# Patient Record
Sex: Male | Born: 1965 | Race: White | Hispanic: No | Marital: Married | Smoking: Never smoker
Health system: Southern US, Community
[De-identification: ages and names within clinical notes are randomized; demographics above are authoritative.]

## PROBLEM LIST (undated history)

## (undated) DIAGNOSIS — I4510 Unspecified right bundle-branch block: Secondary | ICD-10-CM

## (undated) DIAGNOSIS — G4733 Obstructive sleep apnea (adult) (pediatric): Secondary | ICD-10-CM

## (undated) DIAGNOSIS — J189 Pneumonia, unspecified organism: Secondary | ICD-10-CM

## (undated) DIAGNOSIS — E785 Hyperlipidemia, unspecified: Secondary | ICD-10-CM

## (undated) DIAGNOSIS — R7303 Prediabetes: Secondary | ICD-10-CM

## (undated) DIAGNOSIS — I10 Essential (primary) hypertension: Secondary | ICD-10-CM

## (undated) DIAGNOSIS — T7840XA Allergy, unspecified, initial encounter: Secondary | ICD-10-CM

## (undated) HISTORY — DX: Essential (primary) hypertension: I10

## (undated) HISTORY — DX: Unspecified right bundle-branch block: I45.10

## (undated) HISTORY — DX: Hyperlipidemia, unspecified: E78.5

## (undated) HISTORY — PX: HAND SURGERY: SHX662

## (undated) HISTORY — DX: Allergy, unspecified, initial encounter: T78.40XA

## (undated) HISTORY — DX: Obstructive sleep apnea (adult) (pediatric): G47.33

## (undated) HISTORY — PX: HERNIA REPAIR: SHX51

## (undated) HISTORY — PX: TONSILLECTOMY: SHX5217

## (undated) HISTORY — PX: INGUINAL HERNIA REPAIR: SHX194

## (undated) HISTORY — DX: Prediabetes: R73.03

---

## 2003-05-01 ENCOUNTER — Emergency Department (HOSPITAL_COMMUNITY): Admission: EM | Admit: 2003-05-01 | Discharge: 2003-05-02 | Payer: Self-pay | Admitting: Emergency Medicine

## 2003-05-02 ENCOUNTER — Emergency Department (HOSPITAL_COMMUNITY): Admission: EM | Admit: 2003-05-02 | Discharge: 2003-05-02 | Payer: Self-pay | Admitting: Emergency Medicine

## 2005-09-16 ENCOUNTER — Inpatient Hospital Stay (HOSPITAL_COMMUNITY): Admission: EM | Admit: 2005-09-16 | Discharge: 2005-09-17 | Payer: Self-pay | Admitting: Emergency Medicine

## 2007-03-31 ENCOUNTER — Ambulatory Visit: Payer: Self-pay | Admitting: Pulmonary Disease

## 2007-03-31 DIAGNOSIS — I10 Essential (primary) hypertension: Secondary | ICD-10-CM | POA: Insufficient documentation

## 2007-03-31 DIAGNOSIS — E785 Hyperlipidemia, unspecified: Secondary | ICD-10-CM | POA: Insufficient documentation

## 2007-04-02 ENCOUNTER — Ambulatory Visit (HOSPITAL_BASED_OUTPATIENT_CLINIC_OR_DEPARTMENT_OTHER): Admission: RE | Admit: 2007-04-02 | Discharge: 2007-04-02 | Payer: Self-pay | Admitting: Pulmonary Disease

## 2007-04-02 ENCOUNTER — Ambulatory Visit: Payer: Self-pay | Admitting: Pulmonary Disease

## 2007-04-15 ENCOUNTER — Telehealth (INDEPENDENT_AMBULATORY_CARE_PROVIDER_SITE_OTHER): Payer: Self-pay | Admitting: *Deleted

## 2008-04-30 ENCOUNTER — Encounter: Payer: Self-pay | Admitting: Pulmonary Disease

## 2008-05-04 ENCOUNTER — Ambulatory Visit: Payer: Self-pay | Admitting: Pulmonary Disease

## 2008-05-04 DIAGNOSIS — G4733 Obstructive sleep apnea (adult) (pediatric): Secondary | ICD-10-CM | POA: Insufficient documentation

## 2008-05-06 ENCOUNTER — Telehealth (INDEPENDENT_AMBULATORY_CARE_PROVIDER_SITE_OTHER): Payer: Self-pay | Admitting: *Deleted

## 2008-08-05 ENCOUNTER — Encounter: Admission: RE | Admit: 2008-08-05 | Discharge: 2008-08-05 | Payer: Self-pay | Admitting: Family Medicine

## 2009-09-28 ENCOUNTER — Encounter: Admission: RE | Admit: 2009-09-28 | Discharge: 2009-09-28 | Payer: Self-pay | Admitting: Neurosurgery

## 2009-10-09 ENCOUNTER — Encounter: Admission: RE | Admit: 2009-10-09 | Discharge: 2009-10-09 | Payer: Self-pay | Admitting: Neurosurgery

## 2009-11-03 ENCOUNTER — Encounter: Admission: RE | Admit: 2009-11-03 | Discharge: 2009-11-03 | Payer: Self-pay | Admitting: Neurosurgery

## 2010-05-02 ENCOUNTER — Encounter: Payer: Self-pay | Admitting: Internal Medicine

## 2010-05-04 ENCOUNTER — Ambulatory Visit: Payer: Self-pay | Admitting: Pulmonary Disease

## 2010-05-22 NOTE — Procedures (Signed)
NAME:  [name], CREDIT NO.:  [id number]   MEDICAL RECORD NO.:  [id number]          PATIENT TYPE:  OUT   LOCATION:  SLEEP CENTER                 FACILITY:  [facility]   PHYSICIAN:  Coralyn Helling, MD        DATE OF BIRTH:  01-11-1965   DATE OF STUDY:  04/02/2007                            NOCTURNAL POLYSOMNOGRAM   REFERRING PHYSICIAN:  Coralyn Helling, MD   INDICATION:  This is an individual who has witnessed apnea, snoring, and  hypertension.  He is referred to the sleep lab for evaluation of  hypersomnia with obstructive sleep apnea.   Height is 5 feet 10 inches tall.  Weight is 245 pounds.  BMI is 35.  Neck size is 17.3.   MEDICATIONS:  1. Azor.  2. Crestor.   EPWORTH SLEEPINESS SCORE:  20   SLEEP ARCHITECTURE:  The patient followed a split night protocol.   During the diagnostic portion of the test, the total sleep period was  268 minutes.  Total sleep time was 231 minutes.  Sleep efficiency was  85%.  Sleep latency is 3 minutes which is significantly reduced.  REM  latency is 160 minutes.  The patient spent a total of 18% of total sleep  time in stage 1 sleep, 64% of total sleep time in stage 2 sleep, 0% of  total sleep time in stage 3 sleep, and 17% of the time in REM sleep.  The patient slept predominantly in the nonsupine position.   During the therapeutic portion of the test, sleep period time was 168  minutes, total sleep time 161 minutes.  Sleep efficiency was 88%.  Sleep  latency was 14 minutes.  REM latency was 66 minutes.  The patient spent  a total of 8% of total sleep time in stage 1 sleep, 69% of sleep time in  stage 2 sleep, 0% of sleep time stage 3 sleep, and 23% of sleep time in  REM sleep.  The patient slept predominately in the nonsupine position.   RESPIRATORY DATA:  During the diagnostic portion of the test, the  patient was found to have a apnea-hypopnea index of 52.  There were 3  central apneic events.  The remainder of events were  obstructive in  nature.  The nonsupine apnea-hypopnea index was 24.  The supine apnea-  hypopnea index was 100.  The REM apnea-hypopnea index was 23.  The non-  REM apnea-hypopnea index was 58.  Loud snoring was noted by the  technician.   During the therapeutic portion of the test, the patient was titrated  from a CPAP pressure setting of 5 to 11 cm of water.  At a CPAP pressure  setting of 11 cm of water, the apnea-hypopnea index reduced to 3.  At  this pressure setting, the patient was observed in REM sleep but not  supine sleep, and snoring was eliminated.   OXYGEN DATA:  The baseline oxygenation was 96%.  The oxygen saturation  nadir was 75%.  During the therapeutic portion of the test, at a CPAP  pressure setting of 11 cm of water, the oxygen saturation nadir was 92%.  CARDIAC DATA:  The average heart rate was 74 and the rhythm strip showed  normal sinus rhythm.   MOVEMENT-PARASOMNIA:  The periodic leg movement index was 0.  The  patient had 1 restroom trip.   IMPRESSION:  This study shows evidence for severe obstructive sleep  apnea as demonstrated by an apnea-hypopnea index of 52 with an oxygen  saturation nadir of 75%.  He did have a significant positional, as well  as REM effect to his sleep apnea.   During the therapeutic portion of the test, he was titrated to a CPAP  pressure setting of 11 cm of water.  At this pressure setting, his apnea-  hypopnea index was reduced to 3.  He was observed in REM sleep but not  supine sleep.   In addition to diet, exercise, and weight reduction, the patient would  likely benefit from the initiation of CPAP therapy.      Coralyn Helling, MD  Diplomat, American Board of Sleep Medicine  Electronically Signed     VS/MEDQ  D:  04/08/2007 12:02:27  T:  04/08/2007 12:53:31  Job:  [id number]

## 2010-05-25 NOTE — Discharge Summary (Signed)
[name], [name]                 ACCOUNT NO.:  [id number]   MEDICAL RECORD NO.:  [id number]          PATIENT TYPE:  INP   LOCATION:  6529                         FACILITY:  [facility]   PHYSICIAN:  Mohan N. Sharyn Lull, M.D. DATE OF BIRTH:  08-Oct-1965   DATE OF ADMISSION:  09/16/2005  DATE OF DISCHARGE:  09/17/2005                                 DISCHARGE SUMMARY   ADMITTING DIAGNOSES:  1. Chest pain, right bundle branch block, rule out pulmonary insufficiency      and portal hypertension,.  2. Morbid obesity.  3. Positive family history of coronary artery disease.   DISCHARGE DIAGNOSES:  1. Status post chest pain myocardial infarction, ruled out negative      Persantine Myoview.  2. Right bundle branch block.  3. Hypertension.  4. Elevated blood sugar rule out diabetes mellitus.  5. Hypercholesterolemia.  6. Morbid obesity.  7. Positive family history of coronary artery disease.   DISCHARGE HOME MEDICATIONS:  1. Toprol XL 25 mg 1 tablet daily.  2. Norvasc 5 mg 1 tablet daily.  3. Altace 5 mg 1 capsule daily.  4. Vytorin 10/20, 1 tablet daily.  5. Enteric-coated aspirin 81 mg 1 tablet daily.  6. Nitrostat 0.4 mg sublingual, use as directed.   DIET:  Low salt, low cholesterol.  Patient has been advised to avoid sweets  and starches.  Patient has been advised to reduce rate of fasting blood  sugar and hemoglobin A1c in two weeks.   CONDITION ON DISCHARGE:  Stable.  Follow up with me in 1 to 2 weeks.   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  Went home and then  went to Urgent Care.  Had EKG done  which showed normal sinus rhythm with  right bundle branch block and was referred to the ER for further evaluation.  Patient denies such episodes of tightness in the past but complains of  occasional shortness of breath, denies any palpitations, lightheadedness or  syncope.  Denies any neck trauma.  Denies PND, orthopnea, leg swelling.  Denies cough, fever, chills.   PAST MEDICAL HISTORY:  As above.   PAST SURGICAL HISTORY:  He had left hand surgery in the past, right index  finger surgery in the past, right inguinal hernia surgery as a child.   SOCIAL HISTORY:  He is married, has two children.  No history of smoking,  alcohol abuse or drug abuse.  He works for [employer] ([employer]).   FAMILY HISTORY:  Father is alive.  He had MI x2 in his 17s.  Mother is alive  in good health and brother in good health.   ALLERGIES:  NO KNOWN DRUG ALLERGIES.   HOME MEDICATIONS:  Hydrochlorothiazide 25 mg one tablet daily.  PHYSICAL EXAMINATION:  GENERAL:  He was alert. He was oriented x3, in no  acute distress.  VITAL SIGNS:  Blood pressure was 181/110.  Pulse was 76.  HEENT:  Conjunctivae pink.  NECK:  Supple, no JVD or lymphadenopathy.  LUNGS:  Clear to auscultation without rhonchi or rales.  CARDIOVASCULAR:  S1, S2, normal.  There was a mild systolic murmur and S4  gallop.  ABDOMEN:  Bowel sounds are present, nontender.  EXTREMITIES:  There is no clubbing, cyanosis or edema.   LABORATORY:  There is two sets of point of care.  CPK-MB was 2.3 and 5.0,  which were in normal range.  Troponin-I was less than 0.05.  Cholesterol was  221, LDL was 115, HDL was 39, triglycerides were 333.  Two sets of cardiac  enzymes:  CK of 180, MB 3.9, second set CK 140, MB 3.4.  Troponin-I was 0.02  and 0.02.  His sodium was 135, potassium 3.8, chloride 105, bicarb 25.  Glucose was 86. Repeat fasting blood sugar today was 111.  BUN was 15,  creatinine was 0.1.  His liver enzymes were normal.  His hemoglobin was   15.8, hematocrit 45.2, white count 8.2.   BRIEF HOSPITAL COURSE:  Patient was admitted to telemetry unit and monitored  by serial enzymes and EKG.  Patient did not have any exertional chest pain  during the hospital stay.  Patient subsequently underwent Persantine Myoview  which showed no evidence of __________ ischemia with EF of 56%.  Patient  will be discharged home on above medications and will be followed up in my  office in 2 weeks.  Patient has been advised regarding Nitrostat  modification to which he agrees.  Patient will be followed up in my office  in two weeks.           ______________________________  [name], M.D.     MNH/MEDQ  D:  09/17/2005  T:  09/18/2005  Job:  [id number]

## 2010-12-12 ENCOUNTER — Ambulatory Visit (INDEPENDENT_AMBULATORY_CARE_PROVIDER_SITE_OTHER): Payer: Managed Care, Other (non HMO)

## 2010-12-12 DIAGNOSIS — S301XXA Contusion of abdominal wall, initial encounter: Secondary | ICD-10-CM

## 2011-07-01 ENCOUNTER — Ambulatory Visit: Payer: Managed Care, Other (non HMO)

## 2011-07-01 ENCOUNTER — Ambulatory Visit (INDEPENDENT_AMBULATORY_CARE_PROVIDER_SITE_OTHER): Payer: Managed Care, Other (non HMO) | Admitting: Family Medicine

## 2011-07-01 VITALS — BP 149/84 | HR 83 | Temp 98.7°F | Resp 16 | Ht 71.0 in | Wt 241.0 lb

## 2011-07-01 DIAGNOSIS — M79673 Pain in unspecified foot: Secondary | ICD-10-CM

## 2011-07-01 DIAGNOSIS — S99929A Unspecified injury of unspecified foot, initial encounter: Secondary | ICD-10-CM

## 2011-07-01 DIAGNOSIS — S92909A Unspecified fracture of unspecified foot, initial encounter for closed fracture: Secondary | ICD-10-CM

## 2011-07-01 DIAGNOSIS — M79609 Pain in unspecified limb: Secondary | ICD-10-CM

## 2011-07-01 MED ORDER — HYDROCODONE-ACETAMINOPHEN 5-500 MG PO TABS: 1.0000 | ORAL_TABLET | Freq: Three times a day (TID) | ORAL | Status: AC | PRN

## 2011-07-01 NOTE — Progress Notes (Signed)
Patient Name: [name] Date of Birth: 1965/01/28 Medical Record Number: [id number] Gender: male Date of Encounter: 07/01/2011  History of Present Illness:  [name] is a 46 y.o. very pleasant male patient who presents with the following:  Yesterday while walking dog he twisted his right foot and heard a snap.  He could barely walk on it- not any better today.  It is also swollen.  No previous problems with this foot.    Patient Active Problem List  Diagnosis  . HYPERLIPIDEMIA  . OBSTRUCTIVE SLEEP APNEA  . HYPERTENSION   Past Medical History  Diagnosis Date  . Hyperlipidemia   . Hypertension   . Angina pectoris   . OSA (obstructive sleep apnea)    Past Surgical History  Procedure Date  . Hand surgery     left  . Inguinal hernia repair     right  . Tonsillectomy    History  Substance Use Topics  . Smoking status: Never Smoker   . Smokeless tobacco: Not on file  . Alcohol Use: Not on file   Family History  Problem Relation Age of Onset  . Heart disease Father   . Cancer Mother    Allergies not on file  Medication list has been reviewed and updated.  Prior to Admission medications   Medication Sig Start Date End Date Taking? Authorizing Provider  amLODipine-valsartan (EXFORGE) 10-320 MG per tablet Take 1 tablet by mouth daily.   Yes Historical Provider, MD  aspirin 81 MG tablet Take 81 mg by mouth daily.     Yes Historical Provider, MD  hydrochlorothiazide (MICROZIDE) 12.5 MG capsule Take 12.5 mg by mouth daily.   Yes Historical Provider, MD  rosuvastatin (CRESTOR) 10 MG tablet Take 10 mg by mouth daily.     Yes Historical Provider, MD  amLODipine-olmesartan (AZOR) 10-40 MG per tablet Take 1 tablet by mouth daily.      Historical Provider, MD  naproxen sodium (ALEVE) 220 MG tablet Take 220 mg by mouth at bedtime.      Historical Provider, MD  Omega-3 Fatty Acids (FISH OIL) 1200 MG CAPS Take 1 capsule by mouth daily.      Historical Provider, MD     Review of Systems:  As per HPI- otherwise negative.   Physical Examination: Filed Vitals:   07/01/11 0749  BP: 149/84  Pulse: 83  Temp: 98.7 F (37.1 C)  Resp: 16   Filed Vitals:   07/01/11 0749  Height: 5\' 11"  (1.803 m)  Weight: 241 lb (109.317 kg)   Body mass index is 33.61 kg/(m^2). Ideal Body Weight: Weight in (lb) to have BMI = 25: 178.9    GEN: WDWN, NAD, Non-toxic, Alert & Oriented x 3, obese HEENT: Atraumatic, Normocephalic.  Ears and Nose: No external deformity. EXTR: No clubbing/cyanosis/edema NEURO: Normal gait.  PSYCH: Normally interactive. Conversant. Not depressed or anxious appearing.  Calm demeanor.  Right foot:  Tender and swollen along the 5th MT.  Ankle negative.  Achilles intact.  Normal pulses and perfusion/ cap refill  [facility] reading (PRIMARY) by  Dr. Patsy Lager.  Right foot: 5th MT shaft fracture, non- displaced.   RIGHT FOOT COMPLETE - 3+ VIEW  Comparison: None.  Findings: There is a nondisplaced fracture involving the proximal shaft of the fifth metatarsal. Overlying soft tissue swelling. No additional evidence of acute fracture.  IMPRESSION: Nondisplaced fifth metatarsal shaft fracture.  Assessment and Plan: 1. Pain in foot  DG Foot Complete Right  2. Injury, foot  3. Foot fracture  HYDROcodone-acetaminophen (VICODIN) 5-500 MG per tablet   Right 5th MT fracture.  Placed in short Cam boot- he has crutches already.  Instructed to avoid WB by using crutches.  Vicodin PRN, ice and elevate.  Refer to ortho this week.     Gave note for his job- he works in [employer] and walks all day at his job. OOW until released by ortho.    Abbe Amsterdam, MD

## 2011-07-26 ENCOUNTER — Telehealth: Payer: Self-pay

## 2011-07-26 NOTE — Telephone Encounter (Signed)
Patient needs a note for his employer that states that he was ordered out of work effective 07/01/11 and not the day that he was seen at [facility]. Please contact patient on his cell at [phone].

## 2011-07-27 NOTE — Telephone Encounter (Signed)
Looks like this should be ok to do, correct?

## 2011-07-28 NOTE — Telephone Encounter (Signed)
Letter written and patient notified, he states [employer] may need MD to complete another form-- if so he will drop it off--otherwise this should be sufficient.

## 2011-07-28 NOTE — Telephone Encounter (Signed)
This is fine.  OOW starting 07/01/11

## 2013-03-06 ENCOUNTER — Ambulatory Visit (INDEPENDENT_AMBULATORY_CARE_PROVIDER_SITE_OTHER): Payer: Managed Care, Other (non HMO) | Admitting: Physician Assistant

## 2013-03-06 VITALS — BP 118/80 | HR 83 | Temp 99.3°F | Resp 18 | Wt 255.0 lb

## 2013-03-06 DIAGNOSIS — R059 Cough, unspecified: Secondary | ICD-10-CM

## 2013-03-06 DIAGNOSIS — R05 Cough: Secondary | ICD-10-CM

## 2013-03-06 DIAGNOSIS — J329 Chronic sinusitis, unspecified: Secondary | ICD-10-CM

## 2013-03-06 MED ORDER — HYDROCOD POLST-CHLORPHEN POLST 10-8 MG/5ML PO LQCR: 5.0000 mL | Freq: Two times a day (BID) | ORAL | Status: AC

## 2013-03-06 MED ORDER — DOXYCYCLINE HYCLATE 100 MG PO TABS: 100.0000 mg | ORAL_TABLET | Freq: Two times a day (BID) | ORAL | Status: DC

## 2013-03-06 MED ORDER — IPRATROPIUM BROMIDE 0.06 % NA SOLN: 2.0000 | Freq: Three times a day (TID) | NASAL | Status: DC

## 2013-03-06 NOTE — Progress Notes (Signed)
   Subjective:    Patient ID: [name], male    DOB: 1965/08/05, 48 y.o.   MRN: [id number]  HPI Pt presents to clinic with about 10 day h/o sinus pressure and congestion with green rhinorrhea and then about 4 days ago started with a cough and sputum production.  He has been using a lot of cold preps and they seem to not be helping. He is worried because the cough is starting to keep him up at night and he has not been able to use his CPAP because he cannot breath through his nose.  He is not SOB and not wheezing -  Most of his cough feels like it is coming from his throat but it is starting to burn deeper in his chest.  Th eleft side of his face hurts and the teeth on that side are uncomfortable as well as he is having some slight dizziness.  Pt has been using more wood to heat his house over the last several days.  OTC meds - cold preps Sick contacts - family Flu vaccine - no  Review of Systems  Constitutional: Positive for chills. Negative for fever.  HENT: Positive for congestion, postnasal drip (better now) and rhinorrhea (green with some blood).   Respiratory: Positive for cough (green).   Gastrointestinal: Negative for nausea, vomiting and diarrhea.  Musculoskeletal: Positive for myalgias.  Neurological: Positive for dizziness and headaches.       Objective:   Physical Exam  Vitals reviewed. Constitutional: He is oriented to person, place, and time. He appears well-developed and well-nourished.  HENT:  Head: Normocephalic and atraumatic.  Right Ear: Hearing, tympanic membrane, external ear and ear canal normal.  Left Ear: Hearing, tympanic membrane, external ear and ear canal normal.  Nose: Mucosal edema (red - dried blood in both nares) present. Right sinus exhibits no maxillary sinus tenderness and no frontal sinus tenderness. Left sinus exhibits no maxillary sinus tenderness and no frontal sinus tenderness.  Mouth/Throat: Uvula is midline, oropharynx is clear and moist and  mucous membranes are normal.  Eyes: Conjunctivae are normal.  Neck: Neck supple.  Cardiovascular: Normal rate, regular rhythm and normal heart sounds.   No murmur heard. Pulmonary/Chest: Effort normal and breath sounds normal. He has no wheezes.  Lymphadenopathy:    He has no cervical adenopathy.  Neurological: He is alert and oriented to person, place, and time.  Skin: Skin is warm and dry.  Psychiatric: He has a normal mood and affect. His behavior is normal. Judgment and thought content normal.       Assessment & Plan:  Sinus infection - Plan: doxycycline (VIBRA-TABS) 100 MG tablet, ipratropium (ATROVENT) 0.06 % nasal spray  Cough - Plan: chlorpheniramine-HYDROcodone (TUSSIONEX PENNKINETIC ER) 10-8 MG/5ML LQCR  Will treat for sinus infection due to the length of time and pain that he is having.  We will treat his cough and he will continue the Mucincex he has at home and stop the sudafed.  We will try Atrovent NS to see if we can decrease his nasal congestion so he can start using his CPAP again.  Benny LennertSarah Mikaeel Petrow PA-C 03/06/2013 11:03 AM

## 2018-04-15 ENCOUNTER — Telehealth: Payer: Self-pay | Admitting: Neurology

## 2018-04-15 NOTE — Telephone Encounter (Signed)
Due to current COVID 19 pandemic, our office is severely reducing in office visits for at least the next 2 weeks, in order to minimize the risk to our patients and healthcare providers. Our staff will contact you for next steps. Pt understands that although there may be some limitations with this type of visit, we will take all precautions to reduce any security or privacy concerns. Pt understands that this will be treated like an in office visit and we will file with pt's insurance, and there may be a patient responsible charge related to this service. Pt's email is [email or web address]. Pt understands that the [employer] webex software must be downloaded and operational on the device pt plans to use for the visit. Pt understands that the nurse will be calling to go over pt's chart.

## 2018-04-16 ENCOUNTER — Encounter: Payer: Self-pay | Admitting: Neurology

## 2018-04-16 NOTE — Addendum Note (Signed)
Addended by: Geronimo Running A on: 04/16/2018 09:04 AM   Modules accepted: Orders

## 2018-04-16 NOTE — Telephone Encounter (Signed)
I called pt. Pt's meds, allergies, and PMH were updated.  Pt had a sleep study in 2007-2008 and was started on cpap. He stopped using his cppa because it was difficult to tolerate and he had to sleep in a recliner due to neck problems. Pt's cpap is about 45-55 years old.  Pt was instructed on how to measure his neck size prior to his appt next week.  Pt's recent weight is 275 lb and he is 5' 10.5.  Epworth Sleepiness Scale 0= would never doze 1= slight chance of dozing 2= moderate chance of dozing 3= high chance of dozing  Sitting and reading: 0 Watching TV: 1 Sitting inactive in a public place (ex. Theater or meeting): 0 As a passenger in a car for an hour without a break: 0 Lying down to rest in the afternoon: 2 Sitting and talking to someone: 0 Sitting quietly after lunch (no alcohol): 0 In a car, while stopped in traffic: 0 Total: 3  FSS: 18

## 2018-04-16 NOTE — Telephone Encounter (Signed)
I called pt to update his chart and discuss his appt next week. No answer, left a message asking him to call me back.

## 2018-04-23 ENCOUNTER — Ambulatory Visit (INDEPENDENT_AMBULATORY_CARE_PROVIDER_SITE_OTHER): Payer: 59 | Admitting: Neurology

## 2018-04-23 ENCOUNTER — Encounter: Payer: Self-pay | Admitting: Neurology

## 2018-04-23 ENCOUNTER — Other Ambulatory Visit: Payer: Self-pay

## 2018-04-23 DIAGNOSIS — E669 Obesity, unspecified: Secondary | ICD-10-CM

## 2018-04-23 DIAGNOSIS — R51 Headache: Secondary | ICD-10-CM

## 2018-04-23 DIAGNOSIS — G4733 Obstructive sleep apnea (adult) (pediatric): Secondary | ICD-10-CM

## 2018-04-23 DIAGNOSIS — R519 Headache, unspecified: Secondary | ICD-10-CM

## 2018-04-23 NOTE — Progress Notes (Signed)
Huston Foley, MD, PhD [facility] [address], [address] [address] [place], [place] [address]   Virtual Visit via Video Note on 04/23/2018:  I connected with [name] on 04/23/18 at  4:00 PM EDT by a video enabled telemedicine application and verified that I am speaking with the correct person using two identifiers.   I discussed the limitations of evaluation and management by telemedicine and the availability of in person appointments. The patient expressed understanding and agreed to proceed.  History of Present Illness: [name] is a 53 year old right-handed gentleman with an underlying medical history of prediabetes, hypertension, hyperlipidemia, allergies, right bundle branch block, history of angina pectoris and obesity, with whom I am conducting a virtual, video based visit via Webex today, in lieu of a face-to-face visit, for evaluation of his sleep disorder, in particular, concern for underlying obstructive sleep apnea, for re-evaluation. The patient is unaccompanied today and joins via cell phone from home. He is referred by his cardiologist, Dr. Rinaldo Cloud, and I reviewed his note from 02/20/2018.  He reports snoring and a prior diagnosis of moderate obstructive sleep apnea.prior sleep study results are not available for my review today. He estimates that he had a sleep study over 10 years ago, perhaps even 15 years ago. He tried a fullface mask but could not tolerate it. He ended up using nasal pillows and uses CPAP for some years in the beginning but has not been on it for years. He had discomfort in his neck and because he could not sleep in his bed with the neck pain and ended up sleeping in the recliner he eventually gave up using his CPAP in a few years.   His Epworth sleepiness score is 3 out of 24, fatigue severity score is 18 out of 63. He would be willing to get retested for sleep apnea and consider CPAP therapy again. His blood pressure is now  under much better control, a few years ago he had significant difficulty with blood pressure control, he is compliant with his BP medications. His bedtime is around 8 and rise time around 2:30, he works Adult nurse for the hospital.  He is not aware of any family history of OSA. He had a tonsillectomy as a child. He denies night to night nocturia but does wake up with a headache occasionally, uses either ibuprofen or Excedrin Migraine occasionally. He drinks caffeine in the form of diet soda, 2 per day on average, drinks alcohol occasionally, and is a nonsmoker. He lives with his wife and son. He has an older daughter who is married. He denies restless leg symptoms. They have 2 dogs in the household, one of them sleeps in the bed periodically with them and one of them on the floor in the bedroom. He does not sleep with a TV on. His neck pain has been better, he gets neck injections under Dr. Ollen Bowl every few months. he has gained weight over time. He particularly struggled with weight gain after bilateral metatarsal fractures.  His Past Medical History Is Significant For: Past Medical History:  Diagnosis Date   Allergy    Angina pectoris    Hyperlipidemia    Hypertension    OSA (obstructive sleep apnea)    Prediabetes    RBBB (right bundle branch block)    His Past Surgical History Is Significant For:  His Family History Is Significant For: Family History  Problem Relation Age of Onset   Heart disease Father  Diabetes Father    Cancer Mother     His Social History Is Significant For: Social History   Socioeconomic History   Marital status: Married    Spouse name: Not on file   Number of children: Not on file   Years of education: Not on file   Highest education level: Not on file  Occupational History   Occupation: professional driver  Social Needs   Financial resource strain: Not on file   Food insecurity:    Worry: Not on file    Inability: Not on file    Transportation needs:    Medical: Not on file    Non-medical: Not on file  Tobacco Use   Smoking status: Never Smoker   Smokeless tobacco: Never Used  Substance and Sexual Activity   Alcohol use: Yes   Drug use: No   Sexual activity: Not on file  Lifestyle   Physical activity:    Days per week: Not on file    Minutes per session: Not on file   Stress: Not on file  Relationships   Social connections:    Talks on phone: Not on file    Gets together: Not on file    Attends religious service: Not on file    Active member of club or organization: Not on file    Attends meetings of clubs or organizations: Not on file    Relationship status: Not on file  Other Topics Concern   Not on file  Social History Narrative   Lives with wife   Has children          His Allergies Are:  Allergies  Allergen Reactions   Penicillins Rash  :   His Current Medications Are:  Outpatient Encounter Medications as of 04/23/2018  Medication Sig   amLODipine (NORVASC) 10 MG tablet Take 10 mg by mouth daily.   aspirin 81 MG tablet Take 81 mg by mouth daily.     hydrochlorothiazide (HYDRODIURIL) 25 MG tablet Take 25 mg by mouth daily.   losartan (COZAAR) 100 MG tablet Take 100 mg by mouth daily.   Omega-3 Fatty Acids (FISH OIL) 1200 MG CAPS Take 1 capsule by mouth daily.     rosuvastatin (CRESTOR) 10 MG tablet Take 10 mg by mouth daily.     No facility-administered encounter medications on file as of 04/23/2018.   :   Review of Systems:  Out of a complete 14 point review of systems, all are reviewed and negative with the exception of these symptoms as listed below:  Observations/Objective: His most recent vital signs for my review are from 02/20/2018: Blood pressure 127/83, pulse 72. Weight by self-report is 271 pounds. On examination, he is in no acute distress. He is pleasant and conversant. Face is symmetric, speech clear without dysarthria or hypophonia or voice tremor  noted. Facial animation is normal. He wears bifocal corrective eyeglasses. Airway examination reveals a smaller airway entry, Mallampati class II, longer appearing uvula, tonsils absent. Neck circumference by self-report is 18-1/2 inches. Moderate overbite. He is able to walk without problems, he stands for this visit. Upper body movements are unremarkable, upper extremity coordination grossly intact.  Assessment and Plan: [name] is a 53 year old right-handed gentleman with an underlying medical history of prediabetes, hypertension, hyperlipidemia, allergies, right bundle branch block, history of angina pectoris and obesity, with whom I am conducting a virtual, video based new patient visit via Webex in lieu of a face-to-face visit for re-evaluation of his prior  Dx of obstructive sleep apnea. The patient's medical history and physical exam (albeit limited with current video-based evaluation) are concerning for a diagnosis of obstructive sleep apnea. I discussed with the patient the diagnosis of OSA, its prognosis and treatment options. I explained in particular the risks and ramifications of untreated moderate to severe OSA, especially with respect to developing cardiovascular disease down the Road, including congestive heart failure, difficult to treat hypertension, cardiac arrhythmias, or stroke. Even type 2 diabetes has, in part, been linked to untreated OSA. Symptoms of untreated OSA may include daytime sleepiness, memory problems, mood irritability and mood disorder such as depression and anxiety, lack of energy, as well as recurrent headaches, especially morning headaches. We talked about the importance of weight control. We talked about the importance of maintaining good sleep hygiene. I recommended the following at this time: home sleep test.  I explained the sleep test procedure to the patient and also outlined possible treatment options of OSA, including the use of a custom-made dental  device (which would require a referral to a specialist dentist), upper airway surgical options, (such as UPPP, which would involve a referral to an ENT). I also explained the CPAP vs. AutoPAP treatment option to the patient, who indicated that he would be willing to try CPAP if the need arises. I answered all his questions today and the patient was in agreement. I plan to see the patient back after the sleep study is completed and encouraged him to call with any interim questions, concerns, problems or updates.   Huston FoleySaima Kala Ambriz, MD, PhD    Follow Up Instructions: 1. HST, sleep lab staff will reach out to patient to arrange for either sending the unit to home address or a "drive by pickup" and for tutorial, making sure patient is comfortable with the unit and usage, and return of equipment, if necessary.  2. Consider AutoPap therapy, if home sleep test positive for obstructive sleep apnea, patient agreeable. 3. We talked about alternative treatment options and current limitations, due to virus pandemic.  4. Follow-up after starting AutoPap therapy, follow-up to be scheduled according to set-up date, typically within 31 to 89 days post treatment start. 5. Pursue healthy lifestyle, good sleep hygiene, healthy weight. 6. Call for any interim questions or concerns.    I discussed the assessment and treatment plan with the patient. The patient was provided an opportunity to ask questions and all were answered. The patient agreed with the plan and demonstrated an understanding of the instructions.   The patient was advised to call back or seek an in-person evaluation if the symptoms worsen or if the condition fails to improve as anticipated.  I provided 30 minutes of non-face-to-face time during this encounter.   Huston FoleySaima Kimball Appleby, MD

## 2018-05-14 ENCOUNTER — Other Ambulatory Visit: Payer: Self-pay

## 2018-05-14 ENCOUNTER — Ambulatory Visit (INDEPENDENT_AMBULATORY_CARE_PROVIDER_SITE_OTHER): Payer: 59 | Admitting: Neurology

## 2018-05-14 DIAGNOSIS — G4733 Obstructive sleep apnea (adult) (pediatric): Secondary | ICD-10-CM

## 2018-05-14 DIAGNOSIS — R519 Headache, unspecified: Secondary | ICD-10-CM

## 2018-05-14 DIAGNOSIS — R51 Headache: Secondary | ICD-10-CM

## 2018-05-14 DIAGNOSIS — E669 Obesity, unspecified: Secondary | ICD-10-CM

## 2018-05-21 ENCOUNTER — Telehealth: Payer: Self-pay

## 2018-05-21 NOTE — Telephone Encounter (Signed)
-----   Message from Huston Foley, MD sent at 05/21/2018  7:41 AM EDT ----- Patient referred by Dr. Sharyn Lull, seen virtually by me on 04/23/18, HST on 05/14/18 for re-eval of OSA.    Please call Coel and let him know, that the recent home sleep test confirmed obstructive sleep apnea in the severe range. While I recommend treatment for this in the form CPAP, we are not yet bringing patients in for in-lab testing for CPAP titration studies, due to the virus pandemic; therefore, I suggest we start him on a trial of autoPAP at home, which means, that we don't have to bring him in for a sleep study with CPAP, but will let him start using an autoPAP machine at home, through a DME company (of patient's choice, or as per insurance requirement, as per in [employer], if there are such restrictions, depending on insurance carrier). The DME representative will educate the patient on how to use the machine, how to put the mask on, etc. I have placed an order in the chart. Please send referral, talk to patient, send report to referring MD. We will need a FU in sleep clinic for 10 weeks post-PAP set up, please arrange that with me or one of our NPs.  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 For concerns and questions about how to clean the PAP machine and the supplies and how frequently to change the hose, mask and filters, etc., patient can call the DME company, for more information, education and troubleshooting. Especially in the current situation, I recommend, patients be extra mindful about hand hygiene, handling the PAP equipment only with clean hands, wipe the mask daily, keep little one and four-legged companions (and any other pets for that matter)  away from the machine and mask at all times.     Thanks,   Huston Foley, MD, PhD [facility])

## 2018-05-21 NOTE — Telephone Encounter (Signed)
Pt returned my call. I advised pt that Dr. Frances Furbish reviewed their sleep study results and found that pt has severe osa. Dr. Frances Furbish recommends that pt start an auto pap at home. I reviewed PAP compliance expectations with the pt. Pt is agreeable to starting an auto-PAP. I advised pt that an order will be sent to a DME, [employer], and [employer] will call the pt within about one week after they file with the pt's insurance. [employer] will show the pt how to use the machine, fit for masks, and troubleshoot the auto-PAP if needed. A follow up appt was made for insurance purposes with Shanda Bumps, NP on 08/07/2018 at 8:00am. (Pt requests a Friday.). Pt verbalized understanding to arrive 15 minutes early and bring their auto-PAP. A letter with all of this information in it will be mailed to the pt as a reminder. I verified with the pt that the address we have on file is correct. Pt verbalized understanding of results. Pt had no questions at this time but was encouraged to call back if questions arise. I have sent the order to [employer] and have received confirmation that they have received the order.

## 2018-05-21 NOTE — Progress Notes (Signed)
Patient referred by Dr. Sharyn Lull, seen virtually by me on 04/23/18, HST on 05/14/18 for re-eval of OSA.    Please call Rourke and let him know, that the recent home sleep test confirmed obstructive sleep apnea in the severe range. While I recommend treatment for this in the form CPAP, we are not yet bringing patients in for in-lab testing for CPAP titration studies, due to the virus pandemic; therefore, I suggest we start him on a trial of autoPAP at home, which means, that we don't have to bring him in for a sleep study with CPAP, but will let him start using an autoPAP machine at home, through a DME company (of patient's choice, or as per insurance requirement, as per in [employer], if there are such restrictions, depending on insurance carrier). The DME representative will educate the patient on how to use the machine, how to put the mask on, etc. I have placed an order in the chart. Please send referral, talk to patient, send report to referring MD. We will need a FU in sleep clinic for 10 weeks post-PAP set up, please arrange that with me or one of our NPs.  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 For concerns and questions about how to clean the PAP machine and the supplies and how frequently to change the hose, mask and filters, etc., patient can call the DME company, for more information, education and troubleshooting. Especially in the current situation, I recommend, patients be extra mindful about hand hygiene, handling the PAP equipment only with clean hands, wipe the mask daily, keep little one and four-legged companions (and any other pets for that matter) away from the machine and mask at all times.    Thanks,   Huston Foley,  MD, PhD [facility]

## 2018-05-21 NOTE — Telephone Encounter (Signed)
I called pt to discuss his sleep study results. No answer, left a message asking him to call me back. 

## 2018-05-21 NOTE — Procedures (Signed)
Patient Information     First Name: [name] Last Name: [name] ID: [id number]  Birth Date: May 19, 1965 Age: [age] Gender: Male  Referring Provider: Rinaldo CloudHarwani, Mohan, MD BMI: 36.6 (70/255lbs)   Neck Circ.:  18 '' Epworth:  3/24 FSS: 18/63   Sleep Study Information     Study Date: May 14, 2018 S/H/A Version: 5.1.76.4 / 4.1.1528 / 8576   History: Mr. [name] is a 53 year old right-handed gentleman with an underlying medical history of prediabetes, hypertension, hyperlipidemia, allergies, right bundle branch block, history of angina pectoris and obesity, who was previously diagnosed with obstructive sleep apnea and placed on CPAP therapy. He no longer is on treatment and presents for re-evaluation.   Summary & Diagnosis:     OSA Recommendations:     This home sleep test demonstrates severe obstructive sleep apnea with a total AHI of 43.1/hour and O2 nadir of 71%. Treatment with positive airway pressure (PAP) - in the form of CPAP - is recommended. This will require, ideally, a full night CPAP titration study for proper treatment settings, O2 monitoring and mask fitting. Based on the severity of the sleep disordered breathing, an attended titration study is indicated. However, under the current circumstances (i.e. the COVID-19 pandemic), in order to ensure continuity of care and for the safety of the patient and healthcare professionals, he will be advised to proceed with an autoPAP titration/trial at home. A proper overnight, lab-attended PAP titration study with CPAP may be helpful or needed down the road to optimize treatment, when considered safe. Please note, that untreated obstructive sleep apnea may carry additional perioperative morbidity. Patients with significant obstructive sleep apnea should receive perioperative PAP therapy and the surgeons and particularly the anesthesiologist should be informed of the diagnosis and the severity of the sleep disordered breathing. Patient will be reminded  regarding compliance with the PAP machine and to be mindful of cleanliness with the equipment and timely with supply changes (i.e. changing filter, mask, hose, humidifier chamber on an ongoing basis, as recommended, and cleaning parts that touch the face and nose daily, etc). The patient should be cautioned not to drive, work at heights, or operate dangerous or heavy equipment when tired or sleepy. Review and reiteration of good sleep hygiene measures should be pursued with any patient. Other causes of the patient's symptoms, including circadian rhythm disturbances, an underlying mood disorder, medication effect and/or an underlying medical problem cannot be ruled out based on this test. Clinical correlation is recommended. The patient and his referring provider will be notified of the test results. The [facility], either for a face-to-face or virtual visit, whichever feasible and recommended at the time.  I certify that I have reviewed the raw data recording prior to the issuance of this report in accordance with the standards of the American Academy of Sleep Medicine (AASM).  Huston FoleySaima Rolin Schult, MD, PhD [facility] Diplomat, ABPN (Neurology and Sleep)       Sleep Summary  Oxygen Saturation Statistics   Start Study Time: End Study Time: Total Recording Time:  8:00:39 PM 1:22:22 AM       5 hrs, 21 min  Total Sleep Time % REM of Sleep Time:  4 hrs, 50 min 27.2    Mean: 90 Minimum: 71 Maximum: 100  Mean of Desaturations Nadirs (%):   86  Oxygen Desaturation %:   4-9 10-20 >20 Total  Events Number Total   125  46 72.3 26.6  2 1.2  173 100.0  Oxygen Saturation: <90 <=88 <85 <80 <70  Duration (minutes): Sleep % 78.3 26.9 50.0 20.6 17.2 7.1 4.4 1.5 0.0 0.0     Respiratory Indices      Total Events REM NREM All Night  pRDI:  208  pAHI:  207 ODI:  173  pAHIc:  17  % CSR: 0.0 59.9 59.9 49.1 8.4  37.2 36.9 31.2 1.7 43.4 43.1 36.1 3.5       Pulse Rate Statistics during Sleep (BPM)      Mean: 74 Minimum: 53 Maximum: 103    Indices are calculated using technically valid sleep time of  4 hrs, 47 min. pRDI/pAHI are calculated using oxi desaturations ? 3%   Body Position Statistics  Position Supine Prone Right Left Non-Supine  Sleep (min) 148.7 55.0 46.0 41.0 142.0  Sleep % 51.1 18.9 15.8 14.1 48.9  pRDI 45.6 45.1 13.1 67.1 41.0  pAHI 45.2 45.1 13.1 67.1 41.0  ODI 35.0 38.5 10.5 65.6 37.1     Snoring Statistics Snoring Level (dB) >40 >50 >60 >70 >80 >Threshold (45)  Sleep (min) 136.2 40.1 10.1 0.6 0.0 68.8  Sleep % 46.8 13.8 3.5 0.2 0.0 23.7    Mean: 44 dB Sleep Stages Chart                                                                  pAHI=43.1                                                                               Mild              Moderate                    Severe                                                    5              15                    30

## 2018-05-21 NOTE — Addendum Note (Signed)
Addended by: Huston Foley on: 05/21/2018 07:41 AM   Modules accepted: Orders

## 2018-08-07 ENCOUNTER — Ambulatory Visit: Payer: Self-pay | Admitting: Adult Health

## 2018-08-19 NOTE — Progress Notes (Addendum)
PATIENT: [name] DOB: 06-19-1965  REASON FOR VISIT: follow up HISTORY FROM: patient  Chief Complaint  Patient presents with   Follow-up    New auto pap user. Alone. Rm 5. No new concerns at this time.      HISTORY OF PRESENT ILLNESS: Today 08/20/18 [name] is a 53 y.o. male here today for follow up of OSA on CPAP.  [name] is doing very well on CPAP therapy.  He reports that he is sleeping better and feels more energized during the day.  Compliance report dated 07/20/2018 through 08/18/2018 reveals that he is using CPAP nightly for compliance 100%.  22 of the last 30 days he used CPAP greater than 4 hours for compliance of 73%.  Average usage was 4 hours and 50 minutes.  AHI was 1.5 on 7 to 15 cm of water and an EPR of 3.  There was no significant leak.  He returns today for evaluation  HISTORY: (copied from Dr Teofilo PodAthar's note on 04/23/2018)  Mr. Steven ShamJames Bowen is a 53 year old right-handed gentleman with an underlying medical history of prediabetes, hypertension, hyperlipidemia, allergies, right bundle branch block, history of angina pectoris and obesity, with whom I am conducting a virtual, video based visit via Webex today, in lieu of a face-to-face visit, for evaluation of his sleep disorder, in particular, concern for underlying obstructive sleep apnea, for re-evaluation. The patient is unaccompanied today and joins via cell phone from home. He is referred by his cardiologist, Dr. Rinaldo CloudMohan Harwani, and I reviewed his note from 02/20/2018.  He reports snoring and a prior diagnosis of moderate obstructive sleep apnea.prior sleep study results are not available for my review today. He estimates that he had a sleep study over 10 years ago, perhaps even 15 years ago. He tried a fullface mask but could not tolerate it. He ended up using nasal pillows and uses CPAP for some years in the beginning but has not been on it for years. He had discomfort in his neck and because he could not sleep in his bed  with the neck pain and ended up sleeping in the recliner he eventually gave up using his CPAP in a few years.   His Epworth sleepiness score is 3 out of 24, fatigue severity score is 18 out of 63. He would be willing to get retested for sleep apnea and consider CPAP therapy again. His blood pressure is now under much better control, a few years ago he had significant difficulty with blood pressure control, he is compliant with his BP medications. His bedtime is around 8 and rise time around 2:30, he works Adult nurseproviding linen for the hospital.  He is not aware of any family history of OSA. He had a tonsillectomy as a child. He denies night to night nocturia but does wake up with a headache occasionally, uses either ibuprofen or Excedrin Migraine occasionally. He drinks caffeine in the form of diet soda, 2 per day on average, drinks alcohol occasionally, and is a nonsmoker. He lives with his wife and son. He has an older daughter who is married. He denies restless leg symptoms. They have 2 dogs in the household, one of them sleeps in the bed periodically with them and one of them on the floor in the bedroom. He does not sleep with a TV on. His neck pain has been better, he gets neck injections under Dr. Ollen BowlHarkins every few months.he has gained weight over time. He particularly struggled with weight gain after bilateral  metatarsal fractures.   REVIEW OF SYSTEMS: Out of a complete 14 system review of symptoms, the patient complains only of the following symptoms, none and all other reviewed systems are negative.  Epworth sleepiness scale: 2 Fatigue severity scale: 10   ALLERGIES: Allergies  Allergen Reactions   Penicillins Rash    HOME MEDICATIONS: Outpatient Medications Prior to Visit  Medication Sig Dispense Refill   amLODipine (NORVASC) 10 MG tablet Take 10 mg by mouth daily.     aspirin 81 MG tablet Take 81 mg by mouth daily.       hydrochlorothiazide (HYDRODIURIL) 25 MG tablet Take 25 mg by  mouth daily.     losartan (COZAAR) 100 MG tablet Take 100 mg by mouth daily.     Omega-3 Fatty Acids (FISH OIL) 1200 MG CAPS Take 1 capsule by mouth daily.       rosuvastatin (CRESTOR) 10 MG tablet Take 10 mg by mouth daily.       No facility-administered medications prior to visit.     PAST MEDICAL HISTORY: Past Medical History:  Diagnosis Date   Allergy    Angina pectoris    Hyperlipidemia    Hypertension    OSA (obstructive sleep apnea)    Prediabetes    RBBB (right bundle branch block)     PAST SURGICAL HISTORY: Past Surgical History:  Procedure Laterality Date   HAND SURGERY     left   HERNIA REPAIR     INGUINAL HERNIA REPAIR     right   TONSILLECTOMY      FAMILY HISTORY: Family History  Problem Relation Age of Onset   Heart disease Father    Diabetes Father    Cancer Mother     SOCIAL HISTORY: Social History   Socioeconomic History   Marital status: Married    Spouse name: Not on file   Number of children: Not on file   Years of education: Not on file   Highest education level: Not on file  Occupational History   Occupation: Engineer, structuralprofessional driver  Social Needs   Financial resource strain: Not on file   Food insecurity    Worry: Not on file    Inability: Not on file   Transportation needs    Medical: Not on file    Non-medical: Not on file  Tobacco Use   Smoking status: Never Smoker   Smokeless tobacco: Never Used  Substance and Sexual Activity   Alcohol use: Yes   Drug use: No   Sexual activity: Not on file  Lifestyle   Physical activity    Days per week: Not on file    Minutes per session: Not on file   Stress: Not on file  Relationships   Social connections    Talks on phone: Not on file    Gets together: Not on file    Attends religious service: Not on file    Active member of club or organization: Not on file    Attends meetings of clubs or organizations: Not on file    Relationship status: Not on  file   Intimate partner violence    Fear of current or ex partner: Not on file    Emotionally abused: Not on file    Physically abused: Not on file    Forced sexual activity: Not on file  Other Topics Concern   Not on file  Social History Narrative   Lives with wife   Has children  PHYSICAL EXAM  Vitals:   08/20/18 0739  BP: 138/85  Pulse: 81  Temp: (!) 97.5 F (36.4 C)  TempSrc: Oral  Weight: 290 lb 3.2 oz (131.6 kg)  Height: 5\' 11"  (1.803 m)   Body mass index is 40.47 kg/m.  Generalized: Well developed, in no acute distress  Cardiology: normal rate and rhythm, no murmur noted Respiratory: Clear to auscultation bilaterally Neck circ 19" Neurological examination  Mentation: Alert oriented to time, place, history taking. Follows all commands speech and language fluent Cranial nerve II-XII: Pupils were equal round reactive to light. Extraocular movements were full, visual field were full on confrontational test. Facial sensation and strength were normal. Uvula tongue midline. Head turning and shoulder shrug  were normal and symmetric. Motor: The motor testing reveals 5 over 5 strength of all 4 extremities. Good symmetric motor tone is noted throughout.  Sensory: Sensory testing is intact to soft touch on all 4 extremities. No evidence of extinction is noted.  Coordination: Cerebellar testing reveals good finger-nose-finger and heel-to-shin bilaterally.  Gait and station: Gait is normal.   DIAGNOSTIC DATA (LABS, IMAGING, TESTING) - I reviewed patient records, labs, notes, testing and imaging myself where available.  No flowsheet data found.   No results found for: WBC, HGB, HCT, MCV, PLT No results found for: NA, K, CL, CO2, GLUCOSE, BUN, CREATININE, CALCIUM, PROT, ALBUMIN, AST, ALT, ALKPHOS, BILITOT, GFRNONAA, GFRAA No results found for: CHOL, HDL, LDLCALC, LDLDIRECT, TRIG, CHOLHDL No results found for: HGBA1C No results found for: VITAMINB12 No results  found for: TSH     ASSESSMENT AND PLAN 53 y.o. year old male  has a past medical history of Allergy, Angina pectoris, Hyperlipidemia, Hypertension, OSA (obstructive sleep apnea), Prediabetes, and RBBB (right bundle branch block). here with     ICD-10-CM   1. OBSTRUCTIVE SLEEP APNEA  G47.33     Moo is doing very well on CPAP therapy.  Compliance report shows optimal compliance.  He was encouraged to continue using CPAP nightly and for at least 4 hours each night.  He does not need supplies at this time.  We will follow-up annually, sooner if needed.  He verbalizes understanding and agreement with this plan.   No orders of the defined types were placed in this encounter.    No orders of the defined types were placed in this encounter.     I spent 15 minutes with the patient. 50% of this time was spent counseling and educating patient on plan of care and medications.    Debbora Presto, FNP-C 08/20/2018, 8:03 AM [facility] [address] [place], [place] [address] [phone]  I reviewed the above note and documentation by the Nurse Practitioner and agree with the history, exam, assessment and plan as outlined above. I was immediately available for consultation. Star Age, MD, PhD [facility])

## 2018-08-19 NOTE — Patient Instructions (Signed)
Use CPAP nightly and for greater than 4 hours each night  Sleep Apnea Sleep apnea affects breathing during sleep. It causes breathing to stop for a short time or to become shallow. It can also increase the risk of:  Heart attack.  Stroke.  Being very overweight (obese).  Diabetes.  Heart failure.  Irregular heartbeat. The goal of treatment is to help you breathe normally again. What are the causes? There are three kinds of sleep apnea:  Obstructive sleep apnea. This is caused by a blocked or collapsed airway.  Central sleep apnea. This happens when the brain does not send the right signals to the muscles that control breathing.  Mixed sleep apnea. This is a combination of obstructive and central sleep apnea. The most common cause of this condition is a collapsed or blocked airway. This can happen if:  Your throat muscles are too relaxed.  Your tongue and tonsils are too large.  You are overweight.  Your airway is too small. What increases the risk?  Being overweight.  Smoking.  Having a small airway.  Being older.  Being male.  Drinking alcohol.  Taking medicines to calm yourself (sedatives or tranquilizers).  Having family members with the condition. What are the signs or symptoms?  Trouble staying asleep.  Being sleepy or tired during the day.  Getting angry a lot.  Loud snoring.  Headaches in the morning.  Not being able to focus your mind (concentrate).  Forgetting things.  Less interest in sex.  Mood swings.  Personality changes.  Feelings of sadness (depression).  Waking up a lot during the night to pee (urinate).  Dry mouth.  Sore throat. How is this diagnosed?  Your medical history.  A physical exam.  A test that is done when you are sleeping (sleep study). The test is most often done in a sleep lab but may also be done at home. How is this treated?   Sleeping on your side.  Using a medicine to get rid of mucus in  your nose (decongestant).  Avoiding the use of alcohol, medicines to help you relax, or certain pain medicines (narcotics).  Losing weight, if needed.  Changing your diet.  Not smoking.  Using a machine to open your airway while you sleep, such as: ? An oral appliance. This is a mouthpiece that shifts your lower jaw forward. ? A CPAP device. This device blows air through a mask when you breathe out (exhale). ? An EPAP device. This has valves that you put in each nostril. ? A BPAP device. This device blows air through a mask when you breathe in (inhale) and breathe out.  Having surgery if other treatments do not work. It is important to get treatment for sleep apnea. Without treatment, it can lead to:  High blood pressure.  Coronary artery disease.  In men, not being able to have an erection (impotence).  Reduced thinking ability. Follow these instructions at home: Lifestyle  Make changes that your doctor recommends.  Eat a healthy diet.  Lose weight if needed.  Avoid alcohol, medicines to help you relax, and some pain medicines.  Do not use any products that contain nicotine or tobacco, such as cigarettes, e-cigarettes, and chewing tobacco. If you need help quitting, ask your doctor. General instructions  Take over-the-counter and prescription medicines only as told by your doctor.  If you were given a machine to use while you sleep, use it only as told by your doctor.  If you are having  surgery, make sure to tell your doctor you have sleep apnea. You may need to bring your device with you.  Keep all follow-up visits as told by your doctor. This is important. Contact a doctor if:  The machine that you were given to use during sleep bothers you or does not seem to be working.  You do not get better.  You get worse. Get help right away if:  Your chest hurts.  You have trouble breathing in enough air.  You have an uncomfortable feeling in your back, arms, or  stomach.  You have trouble talking.  One side of your body feels weak.  A part of your face is hanging down. These symptoms may be an emergency. Do not wait to see if the symptoms will go away. Get medical help right away. Call your local emergency services (911 in the [place] Do not drive yourself to the hospital. Summary  This condition affects breathing during sleep.  The most common cause is a collapsed or blocked airway.  The goal of treatment is to help you breathe normally while you sleep. This information is not intended to replace advice given to you by your health care provider. Make sure you discuss any questions you have with your health care provider. Document Released: 10/03/2007 Document Revised: 10/10/2017 Document Reviewed: 08/19/2017 Elsevier Patient Education  2020 [employer].

## 2018-08-20 ENCOUNTER — Ambulatory Visit (INDEPENDENT_AMBULATORY_CARE_PROVIDER_SITE_OTHER): Payer: Managed Care, Other (non HMO) | Admitting: Family Medicine

## 2018-08-20 ENCOUNTER — Encounter: Payer: Self-pay | Admitting: Family Medicine

## 2018-08-20 ENCOUNTER — Other Ambulatory Visit: Payer: Self-pay

## 2018-08-20 VITALS — BP 138/85 | HR 81 | Temp 97.5°F | Ht 71.0 in | Wt 290.2 lb

## 2018-08-20 DIAGNOSIS — G4733 Obstructive sleep apnea (adult) (pediatric): Secondary | ICD-10-CM

## 2018-12-05 ENCOUNTER — Ambulatory Visit (INDEPENDENT_AMBULATORY_CARE_PROVIDER_SITE_OTHER)
Admission: RE | Admit: 2018-12-05 | Discharge: 2018-12-05 | Disposition: A | Discharge status: Home / Self Care | Payer: 59 | Source: Ambulatory Visit

## 2018-12-05 ENCOUNTER — Ambulatory Visit (HOSPITAL_COMMUNITY)
Admission: EM | Admit: 2018-12-05 | Discharge: 2018-12-05 | Disposition: A | Discharge status: Home / Self Care | Payer: 59 | Attending: Emergency Medicine | Admitting: Emergency Medicine

## 2018-12-05 ENCOUNTER — Other Ambulatory Visit: Payer: Self-pay

## 2018-12-05 DIAGNOSIS — R197 Diarrhea, unspecified: Secondary | ICD-10-CM | POA: Insufficient documentation

## 2018-12-05 DIAGNOSIS — R110 Nausea: Secondary | ICD-10-CM | POA: Diagnosis not present

## 2018-12-05 DIAGNOSIS — R519 Headache, unspecified: Secondary | ICD-10-CM

## 2018-12-05 DIAGNOSIS — Z20828 Contact with and (suspected) exposure to other viral communicable diseases: Secondary | ICD-10-CM | POA: Diagnosis not present

## 2018-12-05 DIAGNOSIS — J069 Acute upper respiratory infection, unspecified: Secondary | ICD-10-CM | POA: Diagnosis not present

## 2018-12-05 DIAGNOSIS — R05 Cough: Secondary | ICD-10-CM | POA: Diagnosis not present

## 2018-12-05 DIAGNOSIS — R0981 Nasal congestion: Secondary | ICD-10-CM | POA: Diagnosis not present

## 2018-12-05 DIAGNOSIS — Z20822 Contact with and (suspected) exposure to covid-19: Secondary | ICD-10-CM

## 2018-12-05 LAB — POC SARS CORONAVIRUS 2 AG -  ED: SARS Coronavirus 2 Ag: NEGATIVE

## 2018-12-05 LAB — POC SARS CORONAVIRUS 2 AG: SARS Coronavirus 2 Ag: NEGATIVE

## 2018-12-05 NOTE — ED Provider Notes (Signed)
Virtual Visit via Video Note:  Shadrack Brummitt  initiated request for Telemedicine visit with [facility] team. I connected with Perlie Gold  on 12/05/2018 at 1:49 PM  for a synchronized telemedicine visit using a video enabled HIPPA compliant telemedicine application. I verified that I am speaking with Perlie Gold  using two identifiers. Zigmund Gottron, NP  was physically located in a [facility] Urgent care site and Algis Lehenbauer was located at a different location.   The limitations of evaluation and management by telemedicine as well as the availability of in-person appointments were discussed. Patient was informed that he  may incur a bill ( including co-pay) for this virtual visit encounter. Durwood Dittus  expressed understanding and gave verbal consent to proceed with virtual visit.     History of Present Illness:[name]  is a 53 y.o. male presents with complaints of temp of 99.6, headache, nausea, diarrhea, cough, nasal congestion, chills. Started yesterday afternoon. No chest pain , no shortness of breath . No difficulty breathing. Cough is non productive. Took tylenol cold-flu. It did help with fever. States his boss had covid-19 2-3 weeks ago. Works with Retail banker for health-care facilities. Doesn't smoke. No asthma history.   Past Medical History:  Diagnosis Date  . Allergy   . Angina pectoris   . Hyperlipidemia   . Hypertension   . OSA (obstructive sleep apnea)   . Prediabetes   . RBBB (right bundle branch block)     Allergies  Allergen Reactions  . Penicillins Rash        Observations/Objective: Alert, oriented, non toxic in appearance. Clear coherent speech without difficulty. No increased work of breathing visualized.  No cough throughout exam.   Assessment and Plan: Patient directed to seek covid-19 testing at our urgent care site, he will present for nurse visit for rapid testing, send out testing if this is negative. Work note will be provided at nurse visit  according to result. History and physical consistent with viral illness.  Supportive cares recommended. Return precautions provided. Patient verbalized understanding and agreeable to plan.    Follow Up Instructions:    I discussed the assessment and treatment plan with the patient. The patient was provided an opportunity to ask questions and all were answered. The patient agreed with the plan and demonstrated an understanding of the instructions.   The patient was advised to call back or seek an in-person evaluation if the symptoms worsen or if the condition fails to improve as anticipated.  I provided 15 minutes of non-face-to-face time during this encounter.    Zigmund Gottron, NP  12/05/2018 1:49 PM         Zigmund Gottron, NP 12/05/18 1350

## 2018-12-05 NOTE — ED Triage Notes (Signed)
Pt seen by Rondel Oh, NP for virtual visit.  Presents for nurse visit for Covid test.

## 2018-12-05 NOTE — Discharge Instructions (Signed)
Please go to [facility] or [facility] for your covid-19 testing.  When you check in please state you are there for a nurse visit following your video visit.  We will proceed with testing once you are checked in.  Please isolate until final results are back.  Push fluids to ensure adequate hydration and keep secretions thin.  Tylenol and/or ibuprofen as needed for pain or fevers.  Over the counter medications as needed.  Please be seen in person for any persistent or worsening of symptoms.

## 2018-12-07 LAB — NOVEL CORONAVIRUS, NAA (HOSP ORDER, SEND-OUT TO REF LAB; TAT 18-24 HRS): SARS-CoV-2, NAA: NOT DETECTED

## 2019-08-23 NOTE — Progress Notes (Addendum)
PATIENT: [name] DOB: 07-19-1965  REASON FOR VISIT: follow up HISTORY FROM: patient  Virtual Visit via Telephone Note  I connected with [name] on 08/24/19 at  8:00 AM EDT by telephone and verified that I am speaking with the correct person using two identifiers.   I discussed the limitations, risks, security and privacy concerns of performing an evaluation and management service by telephone and the availability of in person appointments. I also discussed with the patient that there may be a patient responsible charge related to this service. The patient expressed understanding and agreed to proceed.   History of Present Illness:  08/24/19 [name] is a 54 y.o. male here today for follow up for OSA on CPAP.  He continues to do well on CPAP therapy.  He reports using his machine most every night.  He has had several nights throughout the summer where he has slept on the couch as his wife was recently diagnosed with breast cancer.  He has been helping to care for her post mastectomy.  Otherwise he is feeling well.  Compliance report dated 07/24/2019 through 08/22/2019 reveals that he is used CPAP 28 of the last 30 days for compliance of 93%.  He has used CPAP greater than 4 hours 27 of the past 30 days for compliance of 90%.  Average usage on days used was 5 hours and 50 minutes.  Residual AHI was 0.8 on 7 to 15 cm of water and an EPR of 3.  There was no significant leak noted.   History (copied from my note on 08/20/2018)  [name] is a 54 y.o. male here today for follow up of OSA on CPAP.  [name] is doing very well on CPAP therapy.  He reports that he is sleeping better and feels more energized during the day.  Compliance report dated 07/20/2018 through 08/18/2018 reveals that he is using CPAP nightly for compliance 100%.  22 of the last 30 days he used CPAP greater than 4 hours for compliance of 73%.  Average usage was 4 hours and 50 minutes.  AHI was 1.5 on 7 to 15 cm of water  and an EPR of 3.  There was no significant leak.  He returns today for evaluation  HISTORY: (copied from Dr Teofilo Pod note on 04/23/2018)  [name] is a 54 year old right-handed gentleman with an underlying medical history of prediabetes, hypertension, hyperlipidemia, allergies, right bundle branch block, history of angina pectoris and obesity,with whom I am conducting a virtual, video based visit via Webex today, in lieu of a face-to-face visit, for evaluation of his sleep disorder, in particular, concern for underlying obstructive sleep apnea, for re-evaluation. The patient is unaccompanied today and joins [employer] phone from home. He is referred by his cardiologist, Dr. Rinaldo Cloud, and I reviewed his note from 02/20/2018. He reports snoring and a prior diagnosis of moderate obstructive sleep apnea.prior sleep study results are not available for my review today. He estimates that he had a sleep study over 10 years ago, perhaps even 15 years ago. He tried a fullface mask but could not tolerate it. He ended up using nasal pillows and uses CPAP for some years in the beginning but has not been on it for years. He had discomfort in his neck and because he could not sleep in his bed with the neck pain and ended up sleeping in the recliner he eventually gave up using his CPAP in a few years. His Epworth sleepiness score is  3 out of 24, fatigue severity score is 18 out of 63. He would be willing to get retested for sleep apnea and consider CPAP therapy again. His blood pressure is now under much better control, a few years ago he had significant difficulty with blood pressure control, he is compliant with his BP medications. His bedtime is around 8 and rise time around 2:30, he works Adult nurse for the hospital.  He is not aware of any family history of OSA. He had a tonsillectomy as a child. He denies night to night nocturia but does wake up with a headache occasionally, uses either ibuprofen or  Excedrin Migraine occasionally. He drinks caffeine in the form of diet soda, 2 per day on average, drinks alcohol occasionally, and is a nonsmoker. He lives with his wife and son. He has an older daughter who is married. He denies restless leg symptoms. They have 2 dogs in the household, one of them sleeps in the bed periodically with them and one of them on the floor in the bedroom. He does not sleep with a TV on. His neck pain has been better, he gets neck injections under Dr. Ollen Bowl every few months.he has gained weight over time. He particularly struggled with weight gain after bilateral metatarsal fractures.    Observations/Objective:  Generalized: Well developed, in no acute distress  Mentation: Alert oriented to time, place, history taking. Follows all commands speech and language fluent   Assessment and Plan:  54 y.o. year old male  has a past medical history of Allergy, Angina pectoris, Hyperlipidemia, Hypertension, OSA (obstructive sleep apnea), Prediabetes, and RBBB (right bundle branch block). here with    ICD-10-CM   1. OSA on CPAP  G47.33 For home use only DME continuous positive airway pressure (CPAP)   Z99.89      [name] is doing well with CPAP therapy.  Compliance report reveals acceptable compliance.  I have encouraged him to continue using CPAP nightly and for greater than 4 hours each night.  We will update supply orders today.  Healthy lifestyle habits encouraged.  He will follow-up with me in 1 year.  Appointment has been set.  He verbalizes understanding and agreement with this plan.  Orders Placed This Encounter  Procedures  . For home use only DME continuous positive airway pressure (CPAP)    Supplies    Order Specific Question:   Length of Need    Answer:   Lifetime    Order Specific Question:   Patient has OSA or probable OSA    Answer:   Yes    Order Specific Question:   Is the patient currently using CPAP in the home    Answer:   Yes    Order Specific  Question:   Settings    Answer:   Other see comments    Order Specific Question:   CPAP supplies needed    Answer:   Mask, headgear, cushions, filters, heated tubing and water chamber    No orders of the defined types were placed in this encounter.    Follow Up Instructions:  I discussed the assessment and treatment plan with the patient. The patient was provided an opportunity to ask questions and all were answered. The patient agreed with the plan and demonstrated an understanding of the instructions.   The patient was advised to call back or seek an in-person evaluation if the symptoms worsen or if the condition fails to improve as anticipated.  I provided 15 minutes of  non-face-to-face time during this encounter. Patient located in his parked [place] during Mychart visit. Provider is in the office.    Shawnie Dapper, NP   I reviewed the above note and documentation by the Nurse Practitioner and agree with the history, exam, assessment and plan as outlined above. I was available for consultation. Huston Foley, MD, PhD [facility]

## 2019-08-24 ENCOUNTER — Telehealth (INDEPENDENT_AMBULATORY_CARE_PROVIDER_SITE_OTHER): Payer: 59 | Admitting: Family Medicine

## 2019-08-24 ENCOUNTER — Encounter: Payer: Self-pay | Admitting: Family Medicine

## 2019-08-24 DIAGNOSIS — G4733 Obstructive sleep apnea (adult) (pediatric): Secondary | ICD-10-CM | POA: Diagnosis not present

## 2019-08-24 DIAGNOSIS — Z9989 Dependence on other enabling machines and devices: Secondary | ICD-10-CM | POA: Diagnosis not present

## 2019-08-25 NOTE — Progress Notes (Signed)
RE: cpap suplies Received: Today Kathee Delton, RN Thank you, will process.   Shanda Bumps

## 2019-09-07 ENCOUNTER — Observation Stay (HOSPITAL_COMMUNITY): Payer: 59

## 2019-09-07 ENCOUNTER — Observation Stay (HOSPITAL_COMMUNITY)
Admission: EM | Admit: 2019-09-07 | Discharge: 2019-09-08 | Disposition: A | Discharge status: Home / Self Care | Payer: 59 | Attending: Cardiology | Admitting: Cardiology

## 2019-09-07 ENCOUNTER — Other Ambulatory Visit: Payer: Self-pay

## 2019-09-07 ENCOUNTER — Encounter (HOSPITAL_COMMUNITY): Payer: Self-pay | Admitting: *Deleted

## 2019-09-07 ENCOUNTER — Emergency Department (HOSPITAL_COMMUNITY): Payer: 59

## 2019-09-07 DIAGNOSIS — E119 Type 2 diabetes mellitus without complications: Secondary | ICD-10-CM | POA: Insufficient documentation

## 2019-09-07 DIAGNOSIS — I209 Angina pectoris, unspecified: Principal | ICD-10-CM | POA: Insufficient documentation

## 2019-09-07 DIAGNOSIS — Z8679 Personal history of other diseases of the circulatory system: Secondary | ICD-10-CM | POA: Diagnosis not present

## 2019-09-07 DIAGNOSIS — I10 Essential (primary) hypertension: Secondary | ICD-10-CM | POA: Insufficient documentation

## 2019-09-07 DIAGNOSIS — E785 Hyperlipidemia, unspecified: Secondary | ICD-10-CM | POA: Diagnosis not present

## 2019-09-07 DIAGNOSIS — Z79899 Other long term (current) drug therapy: Secondary | ICD-10-CM | POA: Diagnosis not present

## 2019-09-07 DIAGNOSIS — Z6841 Body Mass Index (BMI) 40.0 and over, adult: Secondary | ICD-10-CM | POA: Diagnosis not present

## 2019-09-07 DIAGNOSIS — Z7982 Long term (current) use of aspirin: Secondary | ICD-10-CM | POA: Diagnosis not present

## 2019-09-07 DIAGNOSIS — I249 Acute ischemic heart disease, unspecified: Secondary | ICD-10-CM | POA: Diagnosis present

## 2019-09-07 DIAGNOSIS — Z20822 Contact with and (suspected) exposure to covid-19: Secondary | ICD-10-CM | POA: Diagnosis not present

## 2019-09-07 DIAGNOSIS — G4733 Obstructive sleep apnea (adult) (pediatric): Secondary | ICD-10-CM | POA: Insufficient documentation

## 2019-09-07 DIAGNOSIS — I200 Unstable angina: Secondary | ICD-10-CM

## 2019-09-07 DIAGNOSIS — R079 Chest pain, unspecified: Secondary | ICD-10-CM | POA: Diagnosis present

## 2019-09-07 LAB — BASIC METABOLIC PANEL
Anion gap: 11 (ref 5–15)
BUN: 16 mg/dL (ref 6–20)
CO2: 26 mmol/L (ref 22–32)
Calcium: 9.4 mg/dL (ref 8.9–10.3)
Chloride: 102 mmol/L (ref 98–111)
Creatinine, Ser: 1.16 mg/dL (ref 0.61–1.24)
GFR calc Af Amer: >60 mL/min (ref >60)
GFR calc non Af Amer: >60 mL/min (ref >60)
Glucose, Bld: 147 mg/dL — ABNORMAL HIGH (ref 70–99)
Potassium: 4.2 mmol/L (ref 3.5–5.1)
Sodium: 139 mmol/L (ref 135–145)

## 2019-09-07 LAB — TROPONIN I (HIGH SENSITIVITY)
Troponin I (High Sensitivity): 11 ng/L (ref <18)
Troponin I (High Sensitivity): 11 ng/L (ref <18)
Troponin I (High Sensitivity): 8 ng/L (ref <18)
Troponin I (High Sensitivity): 9 ng/L (ref <18)

## 2019-09-07 LAB — CBC
HCT: 46.2 % (ref 39.0–52.0)
Hemoglobin: 15.2 g/dL (ref 13.0–17.0)
MCH: 29.8 pg (ref 26.0–34.0)
MCHC: 32.9 g/dL (ref 30.0–36.0)
MCV: 90.6 fL (ref 80.0–100.0)
Platelets: 323 10*3/uL (ref 150–400)
RBC: 5.1 MIL/uL (ref 4.22–5.81)
RDW: 12.4 % (ref 11.5–15.5)
WBC: 8.8 10*3/uL (ref 4.0–10.5)
nRBC: 0 % (ref 0.0–0.2)

## 2019-09-07 LAB — CBG MONITORING, ED: Glucose-Capillary: 127 mg/dL — ABNORMAL HIGH (ref 70–99)

## 2019-09-07 LAB — HIV ANTIBODY (ROUTINE TESTING W REFLEX): HIV Screen 4th Generation wRfx: NONREACTIVE

## 2019-09-07 LAB — GLUCOSE, CAPILLARY
Glucose-Capillary: 181 mg/dL — ABNORMAL HIGH (ref 70–99)
Glucose-Capillary: 126 mg/dL — ABNORMAL HIGH (ref 70–99)

## 2019-09-07 LAB — HEMOGLOBIN A1C
Hgb A1c MFr Bld: 6.8 % — ABNORMAL HIGH (ref 4.8–5.6)
Hgb A1c MFr Bld: 6.8 % — ABNORMAL HIGH (ref 4.8–5.6)
Mean Plasma Glucose: 148.46 mg/dL
Mean Plasma Glucose: 148.46 mg/dL

## 2019-09-07 LAB — HEPARIN LEVEL (UNFRACTIONATED): Heparin Unfractionated: <0.1 IU/mL — ABNORMAL LOW (ref 0.30–0.70)

## 2019-09-07 LAB — SARS CORONAVIRUS 2 BY RT PCR (HOSPITAL ORDER, PERFORMED IN ~~LOC~~ HOSPITAL LAB): SARS Coronavirus 2: NEGATIVE

## 2019-09-07 MED ORDER — HEPARIN BOLUS VIA INFUSION
4000.0000 [IU] | Freq: Once | INTRAVENOUS | Status: AC
  Administered 2019-09-07: 4000 [IU] via INTRAVENOUS
  Filled 2019-09-07: qty 4000

## 2019-09-07 MED ORDER — NITROGLYCERIN 0.4 MG SL SUBL: 0.4000 mg | SUBLINGUAL_TABLET | SUBLINGUAL | Status: DC | PRN

## 2019-09-07 MED ORDER — HEPARIN BOLUS VIA INFUSION
3000.0000 [IU] | Freq: Once | INTRAVENOUS | Status: AC
  Administered 2019-09-07: 3000 [IU] via INTRAVENOUS
  Filled 2019-09-07: qty 3000

## 2019-09-07 MED ORDER — ASPIRIN 300 MG RE SUPP: 300.0000 mg | RECTAL | Status: AC

## 2019-09-07 MED ORDER — HEPARIN (PORCINE) 25000 UT/250ML-% IV SOLN
1700.0000 [IU]/h | INTRAVENOUS | Status: DC
  Administered 2019-09-07: 1550 [IU]/h via INTRAVENOUS
  Administered 2019-09-07: 1250 [IU]/h via INTRAVENOUS
  Administered 2019-09-08: 1700 [IU]/h via INTRAVENOUS
  Filled 2019-09-07 (×3): qty 250

## 2019-09-07 MED ORDER — NITROGLYCERIN 2 % TD OINT
0.5000 [in_us] | TOPICAL_OINTMENT | Freq: Once | TRANSDERMAL | Status: AC
  Administered 2019-09-07: 0.5 [in_us] via TOPICAL
  Filled 2019-09-07: qty 1

## 2019-09-07 MED ORDER — REGADENOSON 0.4 MG/5ML IV SOLN
INTRAVENOUS | Status: AC
  Filled 2019-09-07: qty 5

## 2019-09-07 MED ORDER — TECHNETIUM TC 99M TETROFOSMIN IV KIT
31.4000 | PACK | Freq: Once | INTRAVENOUS | Status: AC | PRN
  Administered 2019-09-07: 31.4 via INTRAVENOUS

## 2019-09-07 MED ORDER — SODIUM CHLORIDE 0.9 % IV SOLN: INTRAVENOUS | Status: DC

## 2019-09-07 MED ORDER — METOPROLOL TARTRATE 12.5 MG HALF TABLET
12.5000 mg | ORAL_TABLET | Freq: Two times a day (BID) | ORAL | Status: DC
  Administered 2019-09-07 – 2019-09-08 (×3): 12.5 mg via ORAL
  Filled 2019-09-07 (×3): qty 1

## 2019-09-07 MED ORDER — ACETAMINOPHEN 325 MG PO TABS
650.0000 mg | ORAL_TABLET | ORAL | Status: DC | PRN
  Administered 2019-09-08: 650 mg via ORAL
  Filled 2019-09-07: qty 2

## 2019-09-07 MED ORDER — INSULIN ASPART 100 UNIT/ML ~~LOC~~ SOLN
0.0000 [IU] | Freq: Three times a day (TID) | SUBCUTANEOUS | Status: DC
  Administered 2019-09-07: 2 [IU] via SUBCUTANEOUS
  Administered 2019-09-08: 1 [IU] via SUBCUTANEOUS

## 2019-09-07 MED ORDER — LOSARTAN POTASSIUM 50 MG PO TABS
50.0000 mg | ORAL_TABLET | Freq: Every day | ORAL | Status: DC
  Administered 2019-09-07 – 2019-09-08 (×2): 50 mg via ORAL
  Filled 2019-09-07 (×2): qty 1

## 2019-09-07 MED ORDER — ASPIRIN EC 81 MG PO TBEC
81.0000 mg | DELAYED_RELEASE_TABLET | Freq: Every day | ORAL | Status: DC
  Administered 2019-09-08: 81 mg via ORAL
  Filled 2019-09-07: qty 1

## 2019-09-07 MED ORDER — REGADENOSON 0.4 MG/5ML IV SOLN
0.4000 mg | Freq: Once | INTRAVENOUS | Status: AC
  Administered 2019-09-07: 0.4 mg via INTRAVENOUS
  Filled 2019-09-07: qty 5

## 2019-09-07 MED ORDER — ONDANSETRON HCL 4 MG/2ML IJ SOLN: 4.0000 mg | Freq: Four times a day (QID) | INTRAMUSCULAR | Status: DC | PRN

## 2019-09-07 MED ORDER — OMEGA-3-ACID ETHYL ESTERS 1 G PO CAPS
1.0000 g | ORAL_CAPSULE | Freq: Two times a day (BID) | ORAL | Status: DC
  Administered 2019-09-07 – 2019-09-08 (×2): 1 g via ORAL
  Filled 2019-09-07 (×4): qty 1

## 2019-09-07 MED ORDER — ASPIRIN 81 MG PO CHEW: 324.0000 mg | CHEWABLE_TABLET | ORAL | Status: AC

## 2019-09-07 MED ORDER — MORPHINE SULFATE (PF) 4 MG/ML IV SOLN
4.0000 mg | Freq: Once | INTRAVENOUS | Status: AC
  Administered 2019-09-07: 4 mg via INTRAVENOUS
  Filled 2019-09-07: qty 1

## 2019-09-07 MED ORDER — PANTOPRAZOLE SODIUM 40 MG PO TBEC
40.0000 mg | DELAYED_RELEASE_TABLET | Freq: Every day | ORAL | Status: DC
  Administered 2019-09-07 – 2019-09-08 (×2): 40 mg via ORAL
  Filled 2019-09-07 (×2): qty 1

## 2019-09-07 MED ORDER — AMLODIPINE BESYLATE 5 MG PO TABS
5.0000 mg | ORAL_TABLET | Freq: Every day | ORAL | Status: DC
  Administered 2019-09-07 – 2019-09-08 (×2): 5 mg via ORAL
  Filled 2019-09-07 (×2): qty 1

## 2019-09-07 MED ORDER — ASPIRIN EC 325 MG PO TBEC
325.0000 mg | DELAYED_RELEASE_TABLET | Freq: Once | ORAL | Status: AC
  Administered 2019-09-07: 325 mg via ORAL
  Filled 2019-09-07: qty 1

## 2019-09-07 MED ORDER — NITROGLYCERIN 2 % TD OINT
0.5000 [in_us] | TOPICAL_OINTMENT | Freq: Three times a day (TID) | TRANSDERMAL | Status: DC
  Administered 2019-09-07 – 2019-09-08 (×4): 0.5 [in_us] via TOPICAL
  Filled 2019-09-07 (×4): qty 1
  Filled 2019-09-07: qty 0.5

## 2019-09-07 MED ORDER — ROSUVASTATIN CALCIUM 5 MG PO TABS
10.0000 mg | ORAL_TABLET | Freq: Every day | ORAL | Status: DC
  Administered 2019-09-07 – 2019-09-08 (×2): 10 mg via ORAL
  Filled 2019-09-07 (×2): qty 2

## 2019-09-07 NOTE — ED Notes (Addendum)
Taken to NM  

## 2019-09-07 NOTE — ED Provider Notes (Signed)
[facility] EMERGENCY DEPARTMENT Provider Note   CSN: [id number] Arrival date & time: 09/07/19  0403     History Chief Complaint  Patient presents with  . Chest Pain    [name] is a 54 y.o. male with a history of HTN, prediabetes, OSA on CPAP, HLD, angina pectoris, and right bundle branch block who presents to the emergency department with a chief complaint of chest pain.   The patient reports sudden onset right-sided 4/10 chest pain that will intermittently radiate to his mid chest that began at approximately 01:45 AM.  He states that the pain feels sharp and pressure-like, but intermittently feels "thumping" and "squeezing".  Chest pain has been accompanied by shortness of breath, dizziness, and lightheadedness.  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  Chest pain is nonradiating.  He reports that 2 days ago that he felt some pain in the left side of his neck and in his jaw, but reports that this pain is since resolved.  He does feel as if he is having pain and paresthesias in his right arm, but reports a history of chronic symptoms in his right arm secondary to a bulging disc in his neck.  He also notes that he was diaphoretic with the episodes, but reports that he sweats easily.  He also reports that he has noticed brief chest pain with exertion intermittently over the last few weeks, but attributed the symptoms to heartburn.   No leg swelling, orthopnea, PND, fevers, chills, abdominal pain, nausea, back pain, palpitations, visual changes, numbness, weakness, headache.     He was last seen by Dr. Sharyn Lull approximately 1 to 2 months ago.  He had a nuclear stress test in 2007 when he had a similar episode of chest pain that he states was much more severe than his current symptoms.  No history of previous echo or cardiac catheterization.    He is a never smoker.  He denies illicit or recreational  substance use.  He drinks alcohol socially.  Reports that his father has a history of CAD and had an MI in his 11s.  He has been compliant with his home medications except for aspirin, which he takes when he remembers to.  He did not take a dose prior to arrival today.He is fully vaccinated against COVID-19.  Cardiology: Dr. Sharyn Lull  The history is provided by the patient and medical records. No language interpreter was used.    HPI: A 54 year old patient with a history of hypertension, hypercholesterolemia and obesity presents for evaluation of chest pain. Initial onset of pain was approximately 3-6 hours ago. The patient's chest pain is described as heaviness/pressure/tightness, is sharp and is worse with exertion. The patient's chest pain is not middle- or left-sided, is not well-localized and does radiate to the arms/jaw/neck. The patient does not complain of nausea and denies diaphoresis. The patient has no history of stroke, has no history of peripheral artery disease, has not smoked in the past 90 days, denies any history of treated diabetes and has no relevant family history of coronary artery disease (first degree relative at less than age 35).   Past Medical History:  Diagnosis Date  . Allergy   . Angina pectoris   . Hyperlipidemia   . Hypertension   . OSA (obstructive sleep apnea)   . Prediabetes   . RBBB (right bundle branch block)     Patient Active Problem List  Diagnosis Date Noted  . OSA on CPAP 05/04/2008  . HYPERLIPIDEMIA 03/31/2007  . HYPERTENSION 03/31/2007    Past Surgical History:  Procedure Laterality Date  . HAND SURGERY     left  . HERNIA REPAIR    . INGUINAL HERNIA REPAIR     right  . TONSILLECTOMY         Family History  Problem Relation Age of Onset  . Heart disease Father   . Diabetes Father   . Cancer Mother     Social History   Tobacco Use  . Smoking status: Never Smoker  . Smokeless tobacco: Never Used  Substance Use Topics  .  Alcohol use: Yes  . Drug use: No    Home Medications Prior to Admission medications   Medication Sig Start Date End Date Taking? Authorizing Provider  amLODipine (NORVASC) 10 MG tablet Take 10 mg by mouth daily.   Yes [provider]  aspirin 81 MG tablet Take 81 mg by mouth daily.     Yes [provider]  hydrochlorothiazide (HYDRODIURIL) 25 MG tablet Take 25 mg by mouth daily.   Yes [provider]  ibuprofen (ADVIL) 200 MG tablet Take 800 mg by mouth every 8 (eight) hours as needed for moderate pain.   Yes [provider]  losartan (COZAAR) 100 MG tablet Take 100 mg by mouth daily.   Yes [provider]  Omega-3 Fatty Acids (FISH OIL) 1200 MG CAPS Take 1 capsule by mouth daily.     Yes [provider]  rosuvastatin (CRESTOR) 10 MG tablet Take 10 mg by mouth daily.     Yes [provider]    Allergies    Penicillins  Review of Systems   Review of Systems  Constitutional: Positive for diaphoresis. Negative for appetite change and fever.  HENT: Negative for congestion and sore throat.        Jaw bone  Eyes: Negative for visual disturbance.  Respiratory: Positive for shortness of breath. Negative for cough and wheezing.   Cardiovascular: Positive for chest pain. Negative for palpitations and leg swelling.  Gastrointestinal: Negative for abdominal pain, constipation, diarrhea, nausea and vomiting.  Genitourinary: Negative for dysuria.  Musculoskeletal: Positive for neck pain. Negative for back pain, gait problem, joint swelling, myalgias and neck stiffness.  Skin: Negative for rash and wound.  Allergic/Immunologic: Negative for immunocompromised state.  Neurological: Positive for dizziness and light-headedness. Negative for seizures, syncope, speech difficulty, weakness, numbness and headaches.  Psychiatric/Behavioral: Negative for confusion.    Physical Exam Updated Vital Signs BP (!) 145/97 (BP Location: Left Arm)    Pulse 86   Temp 98.5 F (36.9 C) (Oral)   Resp 20   Ht 5\' 10"  (1.778 m)   Wt 129.3 kg   SpO2 94%   BMI 40.89 kg/m   Physical Exam Vitals and nursing note reviewed.  Constitutional:      General: He is not in acute distress.    Appearance: He is well-developed. He is not ill-appearing, toxic-appearing or diaphoretic.     Comments: Uncomfortable appearing  HENT:     Head: Normocephalic.  Eyes:     General: No scleral icterus.    Extraocular Movements: Extraocular movements intact.     Conjunctiva/sclera: Conjunctivae normal.     Pupils: Pupils are equal, round, and reactive to light.  Cardiovascular:     Rate and Rhythm: Normal rate and regular rhythm.     Pulses: Normal pulses.  Carotid pulses are 2+ on the right side and 2+ on the left side.      Radial pulses are 2+ on the right side and 2+ on the left side.       Dorsalis pedis pulses are 2+ on the right side and 2+ on the left side.       Posterior tibial pulses are 2+ on the right side and 2+ on the left side.     Heart sounds: Normal heart sounds. No murmur heard.  No friction rub. No gallop.   Pulmonary:     Effort: Pulmonary effort is normal. No respiratory distress.     Breath sounds: No stridor. No wheezing, rhonchi or rales.     Comments: No reproducible tenderness palpation to the chest wall.  Lungs are clear to auscultation bilaterally without increased work of breathing. Chest:     Chest wall: No tenderness.  Abdominal:     General: There is distension.     Palpations: Abdomen is soft.  Musculoskeletal:        General: No tenderness.     Cervical back: Neck supple.     Right lower leg: No edema.     Left lower leg: No edema.  Skin:    General: Skin is warm and dry.     Coloration: Skin is not pale.     Findings: No erythema.  Neurological:     Mental Status: He is alert.  Psychiatric:        Behavior: Behavior normal.     ED Results / Procedures / Treatments   Labs (all labs ordered are  listed, but only abnormal results are displayed) Labs Reviewed  BASIC METABOLIC PANEL - Abnormal; Notable for the following components:      Result Value   Glucose, Bld 147 (*)    All other components within normal limits  SARS CORONAVIRUS 2 BY RT PCR (HOSPITAL ORDER, PERFORMED IN [facility])  CBC  TROPONIN I (HIGH SENSITIVITY)  TROPONIN I (HIGH SENSITIVITY)    EKG EKG Interpretation  Date/Time:  Tuesday September 07 2019 04:09:29 EDT Ventricular Rate:  74 PR Interval:  160 QRS Duration: 144 QT Interval:  412 QTC Calculation: 457 R Axis:   -67 Text Interpretation: Normal sinus rhythm Right bundle branch block Left anterior fascicular block Bifascicular block Abnormal ECG Confirmed by Gilda Crease [id number]) on 09/07/2019 6:24:01 AM   Radiology DG Chest 2 View  Result Date: 09/07/2019 CLINICAL DATA:  Chest pain EXAM: CHEST - 2 VIEW COMPARISON:  12/12/2010 FINDINGS: Mildly low lung volumes. There is no edema, consolidation, effusion, or pneumothorax. Normal heart size and mediastinal contours. Artifact from EKG leads. IMPRESSION: No evidence of active disease. Electronically Signed   By: Marnee Spring M.D.   On: 09/07/2019 04:49    Procedures Procedures (including critical care time)  Medications Ordered in ED Medications  aspirin EC tablet 325 mg (325 mg Oral Given 09/07/19 0503)  nitroGLYCERIN (NITROGLYN) 2 % ointment 0.5 inch (0.5 inches Topical Given 09/07/19 0542)  morphine 4 MG/ML injection 4 mg (4 mg Intravenous Given 09/07/19 0543)    ED Course  I have reviewed the triage vital signs and the nursing notes.  Pertinent labs & imaging results that were available during my care of the patient were reviewed by me and considered in my medical decision making (see chart for details).    MDM Rules/Calculators/A&P HEAR Score: 5  54 year old male with a history of HTN, prediabetes, OSA on CPAP, HLD, angina pectoris, and right bundle  branch block presenting with chest pain, shortness of breath, dizziness, and lightheadedness.  Patient had questionable diaphoresis, but states that this is common for him and was unchanged from his baseline.  He also reports that he has been having some pain in his left jaw and neck intermittently over the last few days. The patient was discussed with Dr. Blinda Leatherwood, attending physician.   EKG: Sinus rhythm with right bundle branch block and left anterior fascicular block.  There are also T wave inversions and flattening of the T waves in the chest leads.  No previous EKG available for comparison, but patient does have a history of a right bundle branch block as noted in his medical record.  IMAGING: Mildly low lung volumes are noted.  No cardiomegaly.  On my evaluation, no infiltrates or consolidation.  No acute findings.  LABS: No electrolyte derangements.  CBC is unremarkable.  Initial troponin is normal.  Patient is followed by Dr. Sharyn Lull.  It sounds as if he had a nuclear stress test in 2007, but has not had an echo or cardiac catheterization.  His presentation today is concerning for angina.  His HEAR score is 5 today and he has multiple medical comorbidities as well as a family history of cardiovascular disease.  Repeat troponin is pending.  Patient continues to endorse 4 out of 10 chest pain in the ER.    He has been given ASA, nitroglycerin paste, and morphine, and will plan to reassess.   Patient care transferred to [facility] at the end of my shift to follow up on repeat troponin and touch base with cardiology. Patient presentation, ED course, and plan of care discussed with review of all pertinent labs and imaging. Please see his/her note for further details regarding further ED course and disposition.  Final Clinical Impression(s) / ED Diagnoses Final diagnoses:  None    Rx / DC Orders ED Discharge Orders    None       Barkley Boards, PA-C 09/07/19 0710    Gilda Crease, MD 09/09/19 [phone]

## 2019-09-07 NOTE — ED Provider Notes (Signed)
7:29 AM Received signout from previous providers, please see her notes for complete H&P.  This is a 54 year old male, patient of Dr. Sharyn Lull who presents with complaints of chest pain.  Pain started last night at approximately 6 hours ago.  Patient has a HEAR score of 5.  His description of pain is concerning for anginal pain.  He did receive aspirin, morphine, and nitro but report minimal improvement of his symptoms.  Failure rates his pain as 3 out of 10.  He appears comfortable.  He is without any chest wall pain on exam.  Lungs are clear, abdomen soft nontender.  7:54 AM Appreciate consultation from Cardiologist Dr. Sharyn Lull who recommend starting heparin and nitro and he will see pt and determine dispo.    CRITICAL CARE Performed by: Fayrene Helper Total critical care time: 30 minutes Critical care time was exclusive of separately billable procedures and treating other patients. Critical care was necessary to treat or prevent imminent or life-threatening deterioration. Critical care was time spent personally by me on the following activities: development of treatment plan with patient and/or surrogate as well as nursing, discussions with consultants, evaluation of patient's response to treatment, examination of patient, obtaining history from patient or surrogate, ordering and performing treatments and interventions, ordering and review of laboratory studies, ordering and review of radiographic studies, pulse oximetry and re-evaluation of patient's condition.  BP (!) 128/94   Pulse 69   Temp 98.5 F (36.9 C) (Oral)   Resp 17   Ht 5\' 10"  (1.778 m)   Wt 129.3 kg   SpO2 97%   BMI 40.89 kg/m   Results for orders placed or performed during the hospital encounter of 09/07/19  SARS Coronavirus 2 by RT PCR (hospital order, performed in [facility] lab) Nasopharyngeal Nasopharyngeal Swab   Specimen: Nasopharyngeal Swab  Result Value Ref Range   SARS Coronavirus 2 NEGATIVE NEGATIVE   Basic metabolic panel  Result Value Ref Range   Sodium 139 135 - 145 mmol/L   Potassium 4.2 3.5 - 5.1 mmol/L   Chloride 102 98 - 111 mmol/L   CO2 26 22 - 32 mmol/L   Glucose, Bld 147 (H) 70 - 99 mg/dL   BUN 16 6 - 20 mg/dL   Creatinine, Ser [facility] 0.61 - 1.24 mg/dL   Calcium 9.4 8.9 - 5.49 mg/dL   GFR calc non Af Amer >60 >60 mL/min   GFR calc Af Amer >60 >60 mL/min   Anion gap 11 5 - 15  CBC  Result Value Ref Range   WBC 8.8 4.0 - 10.5 K/uL   RBC 5.10 4.22 - 5.81 MIL/uL   Hemoglobin 15.2 13.0 - 17.0 g/dL   HCT 82.6 39 - 52 %   MCV 90.6 80.0 - 100.0 fL   MCH 29.8 26.0 - 34.0 pg   MCHC 32.9 30.0 - 36.0 g/dL   RDW 41.5 83.0 - 94.0 %   Platelets 323 150 - 400 K/uL   nRBC 0.0 0.0 - 0.2 %  Troponin I (High Sensitivity)  Result Value Ref Range   Troponin I (High Sensitivity) 11 <18 ng/L  Troponin I (High Sensitivity)  Result Value Ref Range   Troponin I (High Sensitivity) 11 <18 ng/L   DG Chest 2 View  Result Date: 09/07/2019 CLINICAL DATA:  Chest pain EXAM: CHEST - 2 VIEW COMPARISON:  12/12/2010 FINDINGS: Mildly low lung volumes. There is no edema, consolidation, effusion, or pneumothorax. Normal heart size and mediastinal contours. Artifact from EKG leads.  IMPRESSION: No evidence of active disease. Electronically Signed   By: Marnee Spring M.D.   On: 09/07/2019 04:49       Fayrene Helper, PA-C 09/07/19 [id number]    Lorre Nick, MD 09/07/19 [phone]

## 2019-09-07 NOTE — Progress Notes (Signed)
ANTICOAGULATION CONSULT NOTE - Initial Consult  Pharmacy Consult for heparin Indication: chest pain/ACS  Allergies  Allergen Reactions  . Penicillins Rash    Patient Measurements: Height: 5\' 10"  (177.8 cm) Weight: 129.3 kg (285 lb) IBW/kg (Calculated) : 73 Heparin Dosing Weight: 102.7kg  Vital Signs: Temp: 98.5 F (36.9 C) (08/31 0408) Temp Source: Oral (08/31 0408) BP: 128/94 (08/31 0700) Pulse Rate: 69 (08/31 0700)  Labs: Recent Labs    09/07/19 0419 09/07/19 0635  HGB 15.2  --   HCT 46.2  --   PLT 323  --   CREATININE 1.16  --   TROPONINIHS 11 11    Estimated Creatinine Clearance: 96 mL/min (by C-G formula based on SCr of 1.16 mg/dL).   Medical History: Past Medical History:  Diagnosis Date  . Allergy   . Angina pectoris   . Hyperlipidemia   . Hypertension   . OSA (obstructive sleep apnea)   . Prediabetes   . RBBB (right bundle branch block)    Assessment: 54 YOM presenting with CP, for cards eval and to start heparin.  Not on anticoagulation PTA, CBC wnl.    Goal of Therapy:  Heparin level 0.3-0.7 units/ml Monitor platelets by anticoagulation protocol: Yes   Plan:  Heparin 4000 units IV x 1, and gtt at 1250 units/hr F/u 6 hour heparin level F/u Cards eval/plan  09/09/19, PharmD Clinical Pharmacist ED Pharmacist Phone # [phone] 09/07/2019 8:01 AM

## 2019-09-07 NOTE — ED Notes (Signed)
Pt given diet coke and snacks

## 2019-09-07 NOTE — ED Triage Notes (Signed)
To ED for eval of right side cp that started while at work tonight. States he had same pain in 2007 - had nuclear stress and told he was 'close to having a heart attack'. SOB when walking far distance - this started today. Pain is described as right side of chest and sharp. Radiates to center of chest.

## 2019-09-07 NOTE — H&P (Signed)
[name] is an 54 y.o. male.   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  States felt shortness of breath and nauseous.  Chest pain was sharp and pressure received aspirin and sublingual nitro and morphine with partial relief of chest pain.  States occasionally chest pain radiates to the right, which he attributes to the bulging disc in the neck.  States has been getting intrathecal injection every 3 months.  States had similar pain few days ago which resolved on its own and 2 days ago had some mild discomfort in the left side of the face and left arm.  EKG done in the ED showed normal sinus rhythm with right bundle branch block which is old 2 sets of high-sensitivity troponin I has been negative.  Patient denies relation of chest pain to food breathing or movement.  Denies recent cardiac work-up.  Past Medical History:  Diagnosis Date   Allergy    Angina pectoris    Hyperlipidemia    Hypertension    OSA (obstructive sleep apnea)    Prediabetes    RBBB (right bundle branch block)     Past Surgical History:  Procedure Laterality Date   HAND SURGERY     left   HERNIA REPAIR     INGUINAL HERNIA REPAIR     right   TONSILLECTOMY      Family History  Problem Relation Age of Onset   Heart disease Father    Diabetes Father    Cancer Mother    Social History:  reports that he has never smoked. He has never used smokeless tobacco. He reports current alcohol use. He reports that he does not use drugs.  Allergies:  Allergies  Allergen Reactions   Penicillins Rash    (Not in a hospital admission)   Results for orders placed or performed  during the hospital encounter of 09/07/19 (from the past 48 hour(s))  Basic metabolic panel     Status: Abnormal   Collection Time: 09/07/19  4:19 AM  Result Value Ref Range   Sodium 139 135 - 145 mmol/L   Potassium 4.2 3.5 - 5.1 mmol/L   Chloride 102 98 - 111 mmol/L   CO2 26 22 - 32 mmol/L   Glucose, Bld 147 (H) 70 - 99 mg/dL    Comment: Glucose reference range applies only to samples taken after fasting for at least 8 hours.   BUN 16 6 - 20 mg/dL   Creatinine, Ser 1.30 0.61 - 1.24 mg/dL   Calcium 9.4 8.9 - 86.5 mg/dL   GFR calc non Af Amer >60 >60 mL/min   GFR calc Af Amer >60 >60 mL/min   Anion gap 11 5 - 15    Comment: Performed at [facility], [address], [place], [place] [address]  CBC     Status: None   Collection Time: 09/07/19  4:19 AM  Result Value Ref Range   WBC 8.8 4.0 - 10.5 K/uL   RBC 5.10 4.22 - 5.81 MIL/uL   Hemoglobin 15.2 13.0 - 17.0 g/dL   HCT 62.9 39 - 52 %   MCV 90.6 80.0 - 100.0 fL   MCH 29.8 26.0 - 34.0 pg   MCHC 32.9 30.0 -  36.0 g/dL   RDW 32.9 51.8 - 84.1 %   Platelets 323 150 - 400 K/uL   nRBC 0.0 0.0 - 0.2 %    Comment: Performed at [facility], [address] [place], [place] [address]  Troponin I (High Sensitivity)     Status: None   Collection Time: 09/07/19  4:19 AM  Result Value Ref Range   Troponin I (High Sensitivity) 11 <18 ng/L    Comment: (NOTE) Elevated high sensitivity troponin I (hsTnI) values and significant  changes across serial measurements may suggest ACS but many other  chronic and acute conditions are known to elevate hsTnI results.  Refer to the "Links" section for chest pain algorithms and additional  guidance. Performed at [facility], [address] [place], [place] [address]   SARS Coronavirus 2 by RT PCR (hospital order, performed in [facility] hospital lab) Nasopharyngeal Nasopharyngeal Swab     Status: None   Collection Time: 09/07/19  5:33 AM   Specimen: Nasopharyngeal Swab  Result  Value Ref Range   SARS Coronavirus 2 NEGATIVE NEGATIVE    Comment: (NOTE) SARS-CoV-2 target nucleic acids are NOT DETECTED.  The SARS-CoV-2 RNA is generally detectable in upper and lower respiratory specimens during the acute phase of infection. The lowest concentration of SARS-CoV-2 viral copies this assay can detect is 250 copies / mL. A negative result does not preclude SARS-CoV-2 infection and should not be used as the sole basis for treatment or other patient management decisions.  A negative result may occur with improper specimen collection / handling, submission of specimen other than nasopharyngeal swab, presence of viral mutation(s) within the areas targeted by this assay, and inadequate number of viral copies (<250 copies / mL). A negative result must be combined with clinical observations, patient history, and epidemiological information.  Fact Sheet for Patients:   BoilerBrush.com.cy  Fact Sheet for Healthcare Providers: https://pope.com/  This test is not yet approved or  cleared by the [place] FDA and has been authorized for detection and/or diagnosis of SARS-CoV-2 by FDA under an Emergency Use Authorization (EUA).  This EUA will remain in effect (meaning this test can be used) for the duration of the COVID-19 declaration under Section 564(b)(1) of the Act, 21 U.S.C. section 360bbb-3(b)(1), unless the authorization is terminated or revoked sooner.  Performed at [facility], [address], [place], [place] [address]   Troponin I (High Sensitivity)     Status: None   Collection Time: 09/07/19  6:35 AM  Result Value Ref Range   Troponin I (High Sensitivity) 11 <18 ng/L    Comment: (NOTE) Elevated high sensitivity troponin I (hsTnI) values and significant  changes across serial measurements may suggest ACS but many other  chronic and acute conditions are known to elevate hsTnI results.  Refer to the  "Links" section for chest pain algorithms and additional  guidance. Performed at [facility], [address] [place], [place] [address]    DG Chest 2 View  Result Date: 09/07/2019 CLINICAL DATA:  Chest pain EXAM: CHEST - 2 VIEW COMPARISON:  12/12/2010 FINDINGS: Mildly low lung volumes. There is no edema, consolidation, effusion, or pneumothorax. Normal heart size and mediastinal contours. Artifact from EKG leads. IMPRESSION: No evidence of active disease. Electronically Signed   By: Marnee Spring M.D.   On: 09/07/2019 04:49    Review of Systems  Constitutional: Negative for chills and diaphoresis.  HENT: Negative for sore throat.  Eyes: Negative for discharge.  Respiratory: Positive for shortness of breath. Negative for cough and wheezing.   Cardiovascular: Positive for chest pain. Negative for palpitations and leg swelling.  Gastrointestinal: Negative for abdominal pain.  Genitourinary: Negative for difficulty urinating.  Neurological: Negative for dizziness, light-headedness and headaches.    Blood pressure (!) 131/91, pulse 82, temperature 98.5 F (36.9 C), temperature source Oral, resp. rate 20, height 5\' 10"  (1.778 m), weight 129.3 kg, SpO2 96 %. Physical Exam Constitutional:      General: He is not in acute distress.    Appearance: He is well-developed.  HENT:     Head: Normocephalic and atraumatic.  Eyes:     Extraocular Movements: Extraocular movements intact.     Pupils: Pupils are equal, round, and reactive to light.  Neck:     Thyroid: No thyromegaly.     Trachea: No tracheal deviation.  Cardiovascular:     Rate and Rhythm: Normal rate and regular rhythm.     Heart sounds: No murmur heard.  Gallop present. S4 sounds present.   Pulmonary:     Effort: Pulmonary effort is normal.     Breath sounds: Normal breath sounds. No wheezing.  Abdominal:     General: Bowel sounds are normal.     Palpations: Abdomen is soft.     Tenderness: There is no abdominal  tenderness.  Musculoskeletal:        General: Normal range of motion.     Cervical back: Normal range of motion and neck supple.     Right lower leg: No tenderness. No edema.     Left lower leg: No tenderness. No edema.  Lymphadenopathy:     Cervical: No cervical adenopathy.  Skin:    General: Skin is warm and dry.  Neurological:     General: No focal deficit present.     Mental Status: He is alert and oriented to person, place, and time.      Assessment/Plan Acute coronary syndrome Hypertension Hyperlipidemia Prediabetes Morbid obesity Obstructive sleep apnea Positive family history of coronary artery disease Plan As per orders. We will schedule him for  [facility] two day  protocol, discussed with patient and agrees  [facility], MD 09/07/2019, 9:19 AM

## 2019-09-07 NOTE — Progress Notes (Signed)
Patient arrived to room in NAD, VS stable and patient free from pain. Patient oriented to room and wife at bedside.

## 2019-09-07 NOTE — Progress Notes (Signed)
ANTICOAGULATION CONSULT NOTE - Follow Up Consult  Pharmacy Consult for Heparin Indication: chest pain/ACS  Allergies  Allergen Reactions  . Penicillins Rash    Patient Measurements: Height: 5\' 10"  (177.8 cm) Weight: 129.3 kg (285 lb) IBW/kg (Calculated) : 73 Heparin Dosing Weight:  102.7 kg  Vital Signs: Temp: 98.5 F (36.9 C) (08/31 1500) Temp Source: Oral (08/31 1500) BP: 140/92 (08/31 1500) Pulse Rate: 66 (08/31 1354)  Labs: Recent Labs    09/07/19 0419 09/07/19 0419 09/07/19 0635 09/07/19 1017 09/07/19 1521  HGB 15.2  --   --   --   --   HCT 46.2  --   --   --   --   PLT 323  --   --   --   --   HEPARINUNFRC  --   --   --   --  <0.10*  CREATININE 1.16  --   --   --   --   TROPONINIHS 11   < > 11 8 9    < > = values in this interval not displayed.    Estimated Creatinine Clearance: 96 mL/min (by C-G formula based on SCr of 1.16 mg/dL).   Assessment:  Anticoag: CP, for cards eval and to start heparin.  Not on anticoagulation PTA, CBC wnl. Hep level <0.1 less than goal. - Hep dosing weight 102.7kg  Goal of Therapy:  Heparin level 0.3-0.7 units/ml Monitor platelets by anticoagulation protocol: Yes   Plan:  Heparin bolus 3000 units and increase infusion to 1550 units/hr Recheck heparin level in 6-8 hrs Daily HL and CBC Lexiscan Myoview two day  protocol   Billie Intriago S. 09/09/19, PharmD, BCPS Clinical Staff Pharmacist [email or web address] , Roseanne Juenger Stillinger 09/07/2019,4:20 PM

## 2019-09-08 LAB — CBC
HCT: 41.1 % (ref 39.0–52.0)
Hemoglobin: 13.6 g/dL (ref 13.0–17.0)
MCH: 30 pg (ref 26.0–34.0)
MCHC: 33.1 g/dL (ref 30.0–36.0)
MCV: 90.7 fL (ref 80.0–100.0)
Platelets: 287 10*3/uL (ref 150–400)
RBC: 4.53 MIL/uL (ref 4.22–5.81)
RDW: 12.6 % (ref 11.5–15.5)
WBC: 9.9 10*3/uL (ref 4.0–10.5)
nRBC: 0 % (ref 0.0–0.2)

## 2019-09-08 LAB — BASIC METABOLIC PANEL
Anion gap: 11 (ref 5–15)
BUN: 18 mg/dL (ref 6–20)
CO2: 23 mmol/L (ref 22–32)
Calcium: 8.7 mg/dL — ABNORMAL LOW (ref 8.9–10.3)
Chloride: 103 mmol/L (ref 98–111)
Creatinine, Ser: 0.88 mg/dL (ref 0.61–1.24)
GFR calc Af Amer: >60 mL/min (ref >60)
GFR calc non Af Amer: >60 mL/min (ref >60)
Glucose, Bld: 111 mg/dL — ABNORMAL HIGH (ref 70–99)
Potassium: 3.8 mmol/L (ref 3.5–5.1)
Sodium: 137 mmol/L (ref 135–145)

## 2019-09-08 LAB — HEPARIN LEVEL (UNFRACTIONATED)
Heparin Unfractionated: 0.2 IU/mL — ABNORMAL LOW (ref 0.30–0.70)
Heparin Unfractionated: 0.43 IU/mL (ref 0.30–0.70)

## 2019-09-08 LAB — GLUCOSE, CAPILLARY
Glucose-Capillary: 143 mg/dL — ABNORMAL HIGH (ref 70–99)
Glucose-Capillary: 111 mg/dL — ABNORMAL HIGH (ref 70–99)
Glucose-Capillary: 143 mg/dL — ABNORMAL HIGH (ref 70–99)

## 2019-09-08 LAB — LIPID PANEL
Cholesterol: 135 mg/dL (ref 0–200)
HDL: 35 mg/dL — ABNORMAL LOW (ref >40)
LDL Cholesterol: 59 mg/dL (ref 0–99)
Total CHOL/HDL Ratio: 3.9 RATIO
Triglycerides: 203 mg/dL — ABNORMAL HIGH (ref <150)
VLDL: 41 mg/dL — ABNORMAL HIGH (ref 0–40)

## 2019-09-08 MED ORDER — ISOSORBIDE MONONITRATE ER 30 MG PO TB24: 30.0000 mg | ORAL_TABLET | Freq: Every day | ORAL | 11 refills | Status: AC

## 2019-09-08 MED ORDER — NITROGLYCERIN 0.4 MG SL SUBL: 0.4000 mg | SUBLINGUAL_TABLET | SUBLINGUAL | 12 refills | Status: AC | PRN

## 2019-09-08 MED ORDER — TECHNETIUM TC 99M TETROFOSMIN IV KIT
32.6000 | PACK | Freq: Once | INTRAVENOUS | Status: AC | PRN
  Administered 2019-09-08: 32.6 via INTRAVENOUS

## 2019-09-08 MED ORDER — AMLODIPINE BESYLATE 5 MG PO TABS: 5.0000 mg | ORAL_TABLET | Freq: Every day | ORAL | 3 refills | Status: AC

## 2019-09-08 MED ORDER — HEPARIN BOLUS VIA INFUSION
2000.0000 [IU] | Freq: Once | INTRAVENOUS | Status: AC
  Administered 2019-09-08: 2000 [IU] via INTRAVENOUS
  Filled 2019-09-08: qty 2000

## 2019-09-08 NOTE — Discharge Summary (Signed)
Discharge summary dictated on 09/08/2019, dictation number is [phone]

## 2019-09-08 NOTE — Progress Notes (Signed)
ANTICOAGULATION CONSULT NOTE - Follow Up Consult  Pharmacy Consult for Heparin Indication: chest pain/ACS  Allergies  Allergen Reactions  . Penicillins Rash    Patient Measurements: Height: 5\' 10"  (177.8 cm) Weight: 129.3 kg (285 lb) IBW/kg (Calculated) : 73 Heparin Dosing Weight: 102.7 kg  Vital Signs: Temp: 98.6 F (37 C) (09/01 1204) Temp Source: Oral (09/01 1204) BP: 137/87 (09/01 1204) Pulse Rate: 64 (09/01 1204)  Labs: Recent Labs    09/07/19 0419 09/07/19 0419 09/07/19 0635 09/07/19 1017 09/07/19 1521 09/08/19 0147 09/08/19 1107  HGB 15.2  --   --   --   --  13.6  --   HCT 46.2  --   --   --   --  41.1  --   PLT 323  --   --   --   --  287  --   HEPARINUNFRC  --   --   --   --  <0.10* 0.20* 0.43  CREATININE 1.16  --   --   --   --  0.88  --   TROPONINIHS 11   < > 11 8 9   --   --    < > = values in this interval not displayed.    Estimated Creatinine Clearance: 126.6 mL/min (by C-G formula based on SCr of 0.88 mg/dL).  Assessment:  54 yr old male on IV heparin for chest pain/ rule out ACS. Not on anticoagulation PTA.  CBC normal.   Last heparin level low (0.20) and received 2000 units IV bolus and drip rate increased from 1550 to 1700 units/hr.  Level is now therapeutic (0.40). No bleeding reported.     No chest pain, troponins negative.  Two-day Lexiscan myoview planned.   Goal of Therapy:  Heparin level 0.3-0.7 units/ml Monitor platelets by anticoagulation protocol: Yes   Plan:   Continue heparin drip at 1700 units/hr  Daily heparin level and CBC.  Monitor for s/sx bleeding.  Follow up myoview and plans.  , 57 Phone: [phone] 09/08/2019,1:02 PM

## 2019-09-08 NOTE — Progress Notes (Signed)
ANTICOAGULATION CONSULT NOTE - Follow Up Consult  Pharmacy Consult for Heparin Indication: chest pain/ACS  Allergies  Allergen Reactions  . Penicillins Rash    Patient Measurements: Height: 5\' 10"  (177.8 cm) Weight: 129.3 kg (285 lb) IBW/kg (Calculated) : 73 Heparin Dosing Weight:  102.7 kg  Vital Signs: Temp: 98.8 F (37.1 C) (08/31 2331) Temp Source: Oral (08/31 2331) BP: 129/81 (08/31 2331) Pulse Rate: 73 (08/31 2331)  Labs: Recent Labs    09/07/19 0419 09/07/19 0419 09/07/19 0635 09/07/19 1017 09/07/19 1521 09/08/19 0147  HGB 15.2  --   --   --   --  13.6  HCT 46.2  --   --   --   --  41.1  PLT 323  --   --   --   --  287  HEPARINUNFRC  --   --   --   --  <0.10* 0.20*  CREATININE 1.16  --   --   --   --   --   TROPONINIHS 11   < > 11 8 9   --    < > = values in this interval not displayed.    Estimated Creatinine Clearance: 96 mL/min (by C-G formula based on SCr of 1.16 mg/dL).   Assessment:  Anticoag: CP, for cards eval and to start heparin.  Not on anticoagulation PTA, CBC wnl. Hep level <0.1 less than goal. - Hep dosing weight 102.7kg  9/1 AM update:  Heparin level sub-therapeutic   Goal of Therapy:  Heparin level 0.3-0.7 units/ml Monitor platelets by anticoagulation protocol: Yes   Plan:  Heparin 2000 units re[employer] heparin to 1700 units/hr 1200 heparin level   , PharmD, BCPS Clinical Pharmacist Phone: [phone]

## 2019-09-08 NOTE — Discharge Instructions (Signed)
Angina  Angina is very bad discomfort or pain in the chest, neck, arm, jaw, or back. The discomfort is caused by a lack of blood in the middle layer of the heart wall (myocardium). What are the causes? This condition is caused by a buildup of fat and cholesterol (plaque) in your arteries (atherosclerosis). This buildup narrows the arteries and makes it hard for blood to flow. What increases the risk? You are more likely to develop this condition if:  You have high levels of cholesterol in your blood.  You have high blood pressure (hypertension).  You have diabetes.  You have a family history of heart disease.  You are not active, or you do not exercise enough.  You feel sad (depressed).  You have been treated with high energy rays (radiation) on the left side of your chest. Other risk factors are:  Using tobacco.  Being very overweight (obese).  Eating a diet high in unhealthy fats (saturated fats).  Having stress, or being exposed to things that cause stress.  Using drugs, such as cocaine. Women have a greater risk for angina if:  They are older than 55.  They have stopped having their period (are in postmenopause). What are the signs or symptoms? Common symptoms of this condition in both men and women may include:  Chest pain, which may: ? Feel like a crushing or squeezing in the chest. ? Feel like a tightness, pressure, fullness, or heaviness in the chest. ? Last for more than a few minutes at a time. ? Stop and come back (recur) after a few minutes.  Pain in the neck, arm, jaw, or back.  Heartburn or upset stomach (indigestion) for no reason.  Being short of breath.  Feeling sick to your stomach (nauseous).  Sudden cold sweats. Women and people with diabetes may have other symptoms that are not usual, such as feeling:  Tired (fatigue).  Worried or nervous (anxious) for no reason.  Weak for no reason.  Dizzy or passing out (fainting). How is this  treated? This condition may be treated with:  Medicines. These are given to: ? Prevent blood clots. ? Prevent heart attack. ? Relax blood vessels and improve blood flow to the heart (nitrates). ? Reduce blood pressure. ? Improve the pumping action of the heart. ? Reduce fat and cholesterol in the blood.  A procedure to widen a narrowed or blocked artery in the heart (angioplasty).  Surgery to allow blood to go around a blocked artery (coronary artery bypass surgery). Follow these instructions at home: Medicines  Take over-the-counter and prescription medicines only as told by your doctor.  Do not take these medicines unless your doctor says that you can: ? NSAIDs. These include:  Ibuprofen.  Naproxen. ? Vitamin supplements that have vitamin A, vitamin E, or both. ? Hormone therapy that contains estrogen with or without progestin. Eating and drinking   Eat a heart-healthy diet that includes: ? Lots of fresh fruits and vegetables. ? Whole grains. ? Low-fat (lean) protein. ? Low-fat dairy products.  Follow instructions from your doctor about what you cannot eat or drink. Activity  Follow an exercise program that your doctor tells you.  Talk with your doctor about joining a program to help improve the health of your heart (cardiac rehab).  When you feel tired, take a break. Plan breaks if you know you are going to feel tired. Lifestyle   Do not use any products that contain nicotine or tobacco. This includes cigarettes, e-cigarettes, and   chewing tobacco. If you need help quitting, ask your doctor.  If your doctor says you can drink alcohol: ? Limit how much you use to:  0-1 drink a day for women who are not pregnant.  0-2 drinks a day for men. ? Be aware of how much alcohol is in your drink. In the [place] one drink equals:  One 12 oz bottle of beer (355 mL).  One 5 oz glass of wine (148 mL).  One 1 oz glass of hard liquor (44 mL). General instructions  Stay  at a healthy weight. If your doctor tells you to do so, work with him or her to lose weight.  Learn to deal with stress. If you need help, ask your doctor.  Keep your vaccines up to date. Get a flu shot every year.  Talk with your doctor if you feel sad. Take a screening test to see if you are at risk for depression.  Work with your doctor to manage any other health problems that you have. These may include diabetes or high blood pressure.  Keep all follow-up visits as told by your doctor. This is important. Get help right away if:  You have pain in your chest, neck, arm, jaw, or back, and the pain: ? Lasts more than a few minutes. ? Comes back. ? Does not get better after you take medicine under your tongue (sublingual nitroglycerin). ? Keeps getting worse. ? Comes more often.  You have any of these problems for no reason: ? Sweating a lot. ? Heartburn or upset stomach. ? Shortness of breath. ? Trouble breathing. ? Feeling sick to your stomach. ? Throwing up (vomiting). ? Feeling more tired than normal. ? Feeling nervous or worrying more than normal. ? Weakness.  You are suddenly dizzy or light-headed.  You pass out. These symptoms may be an emergency. Do not wait to see if the symptoms will go away. Get medical help right away. Call your local emergency services (911 in the [place] Do not drive yourself to the hospital. Summary  Angina is very bad discomfort or pain in the chest, neck, arm, neck, or back.  Symptoms include chest pain, heartburn or upset stomach for no reason, and shortness of breath.  Women or people with diabetes may have symptoms that are not usual, such as feeling nervous or worried for no reason, weak for no reason, or tired.  Take all medicines only as told by your doctor.  You should eat a heart-healthy diet and follow an exercise program. This information is not intended to replace advice given to you by your health care provider. Make sure you  discuss any questions you have with your health care provider. Document Revised: 08/11/2017 Document Reviewed: 08/11/2017 Elsevier Patient Education  2020 Elsevier Inc.  

## 2019-09-08 NOTE — Discharge Summary (Signed)
[name], DELUC[place] MEDICAL RECORD [id number] ACCOUNT [id number] DATE OF BIRTH:Sep 09, 1965 FACILITY: [facility] LOCATION: [facility] PHYSICIAN:Ryonna Cimini Jeoffrey Massed, MD  DISCHARGE SUMMARY  DATE OF DISCHARGE:  09/08/2019  ADMITTING DIAGNOSES: 1.  Acute coronary syndrome, rule out myocardial infarction. 2.  Hypertension. 3.  Hyperlipidemia. 4.  Prediabetes. 5.  Morbid obesity. 6.  Obstructive sleep apnea. 7.  Positive family history of coronary artery disease.  FINAL DIAGNOSES: 1.  Stable angina, myocardial infarction ruled out, mildly abnormal nuclear stress test. 2.  Hypertension. 3.  New onset diabetes mellitus. 4.  Hyperlipidemia. 5.  Morbid obesity. 6.  Obstructive sleep apnea. 7.  Positive family history of coronary artery disease.  DISCHARGE HOME MEDICATIONS: 1.  Isosorbide mononitrate 30 mg 1 tablet daily. 2.  Nitrostat 0.4 mg sublingual p.r.n. 3.  Aspirin 81 mg 1 tablet daily. 4.  Fish oil 1200 mg 1 capsule daily. 5.  Hydrochlorothiazide 25 mg daily. 6.  Losartan 100 mg daily. 7.  Crestor 10 mg daily. 8.  Amlodipine 5 mg daily.  The patient advised to stop ibuprofen.  DIET:  Low salt, low cholesterol, 1800 calories ADA weight reducing diet.  DISCHARGE INSTRUCTIONS:  The patient has been advised to monitor blood pressure and blood sugar regularly.  Follow up with me in 1 week.  CONDITION AT DISCHARGE:  Stable.  BRIEF HISTORY AND HOSPITAL COURSE:  The patient is a 54 year old male with past medical history significant for hypertension, hyperlipidemia, prediabetes, morbid obesity, obstructive sleep apnea, positive family history of coronary artery disease.  He  came to the ER complaining of right-sided chest pain, grade 4/10, occasionally radiating to right arm while at work while working in [employer].  States felt also short of breath and nauseous.  Chest pain was sharp and pressure.  Received aspirin and  sublingual nitro and morphine with partial relief of chest pain.   States occasionally chest pain radiates to the right side, which he attributes to bulging disk in the neck.  States has been getting intrathecal injection every 3 months.  Had similar pain  a few days ago, which resolved on its own and 2 days ago had some mild discomfort on the left side of the face and left arm.  EKG done in the ED showed normal sinus rhythm with right bundle branch block, which is old.  Two sets of high sensitivity  troponin I has been negative.  The patient subsequently had 2 more sets of high sensitivity troponin I, which were also negative.  The patient denies any relation of chest pain to full breathing or movement.  Denies recent cardiac workup.  PHYSICAL EXAMINATION: GENERAL:  He was alert, awake, oriented x3. VITAL SIGNS:  Blood pressure was 131/91, pulse 82, regular. HEENT:  Conjunctivae pink. NECK:  Supple, no JVD, no bruit. LUNGS:  Clear to auscultation without rhonchi or rales. CARDIOVASCULAR:  S1, S2 was normal.  There was no murmur.  There was soft S4 gallop. ABDOMEN:  Soft, obese, nontender. EXTREMITIES:  There was no clubbing, cyanosis or edema. NEUROLOGIC:  Grossly intact.  LABORATORY DATA:  Sodium was 139, potassium 4.2, BUN 16, creatinine 1.16.  Hemoglobin was 15.2, hematocrit 46.2, blood sugar was 147.  Repeat fasting blood sugar was 111.  Hemoglobin A1c was elevated at 6.8.  His cholesterol was 135, HDL was 35, LDL 59,  triglycerides were elevated at 203.  High sensitivity troponin I 4 sets were normal.  Lexiscan stress test showed mild reduction in stress activity in a small segment in the posterior  septal wall of the left ventricle in the mid heart, approaching the  base.  Borderline for inducible ischemia.  Apical thinning versus mild apical scarring.  Normal left ventricular wall motion.  Left ventricular ejection fraction was 70%.  Noninvasive risk stratification low.  BRIEF HOSPITAL COURSE:  The patient was admitted to telemetry unit.  MI was ruled out  by serial enzymes and EKG.  The patient did not have any anginal chest pain during the hospital stay.  The patient underwent nuclear stress test which showed  questionable inducible ischemia in the posterior septal wall as above with normal wall motion and normal EF.  Discussed with the patient about nuclear stress test results and various options for treatment and agreed for medical management.  The patient  has been discussed at length regarding lifestyle changes, compliance with medications, follow up, may need antidiabetic medications in the future if blood sugar remains elevated.  The patient states he has been eating a lot of carbohydrates lately, which  he is planning to quit and watch his diet.  The patient will be followed up closely in my office in 1 week.  VN/NUANCE D:09/08/2019 T:09/08/2019 JOB:[id number]

## 2020-08-24 NOTE — Progress Notes (Deleted)
PATIENT: [name] DOB: 12/18/1965  REASON FOR VISIT: follow up HISTORY FROM: patient  Virtual Visit via Telephone Note  I connected with [name] on 08/24/20 at  7:45 AM EDT by telephone and verified that I am speaking with the correct person using two identifiers.   I discussed the limitations, risks, security and privacy concerns of performing an evaluation and management service by telephone and the availability of in person appointments. I also discussed with the patient that there may be a patient responsible charge related to this service. The patient expressed understanding and agreed to proceed.   History of Present Illness:  08/24/20 ALL: [name] returns for CPAP follow up.     08/24/2019 ALL:  [name] is a 55 y.o. male here today for follow up for OSA on CPAP.  He continues to do well on CPAP therapy.  He reports using his machine most every night.  He has had several nights throughout the summer where he has slept on the couch as his wife was recently diagnosed with breast cancer.  He has been helping to care for her post mastectomy.  Otherwise he is feeling well.  Compliance report dated 07/24/2019 through 08/22/2019 reveals that he is used CPAP 28 of the last 30 days for compliance of 93%.  He has used CPAP greater than 4 hours 27 of the past 30 days for compliance of 90%.  Average usage on days used was 5 hours and 50 minutes.  Residual AHI was 0.8 on 7 to 15 cm of water and an EPR of 3.  There was no significant leak noted.   History (copied from my note on 08/20/2018)  [name] is a 55 y.o. male here today for follow up of OSA on CPAP.  [name] is doing very well on CPAP therapy.  He reports that he is sleeping better and feels more energized during the day.  Compliance report dated 07/20/2018 through 08/18/2018 reveals that he is using CPAP nightly for compliance 100%.  22 of the last 30 days he used CPAP greater than 4 hours for compliance of 73%.  Average usage was  4 hours and 50 minutes.  AHI was 1.5 on 7 to 15 cm of water and an EPR of 3.  There was no significant leak.  He returns today for evaluation   HISTORY: (copied from Dr Teofilo Pod note on 04/23/2018)   [name] is a 55 year old right-handed gentleman with an underlying medical history of prediabetes, hypertension, hyperlipidemia, allergies, right bundle branch block, history of angina pectoris and obesity, with whom I am conducting a virtual, video based visit via Webex today, in lieu of a face-to-face visit, for evaluation of his sleep disorder, in particular, concern for underlying obstructive sleep apnea, for re-evaluation. The patient is unaccompanied today and joins via cell phone from home. He is referred by his cardiologist, Dr. Rinaldo Cloud, and I reviewed his note from 02/20/2018.  He reports snoring and a prior diagnosis of moderate obstructive sleep apnea.prior sleep study results are not available for my review today. He estimates that he had a sleep study over 10 years ago, perhaps even 15 years ago. He tried a fullface mask but could not tolerate it. He ended up using nasal pillows and uses CPAP for some years in the beginning but has not been on it for years. He had discomfort in his neck and because he could not sleep in his bed with the neck pain and ended up sleeping  in the recliner he eventually gave up using his CPAP in a few years.   His Epworth sleepiness score is 3 out of 24, fatigue severity score is 18 out of 63. He would be willing to get retested for sleep apnea and consider CPAP therapy again. His blood pressure is now under much better control, a few years ago he had significant difficulty with blood pressure control, he is compliant with his BP medications. His bedtime is around 8 and rise time around 2:30, he works Adult nurse for the hospital.  He is not aware of any family history of OSA. He had a tonsillectomy as a child. He denies night to night nocturia but does  wake up with a headache occasionally, uses either ibuprofen or Excedrin Migraine occasionally. He drinks caffeine in the form of diet soda, 2 per day on average, drinks alcohol occasionally, and is a nonsmoker. He lives with his wife and son. He has an older daughter who is married. He denies restless leg symptoms. They have 2 dogs in the household, one of them sleeps in the bed periodically with them and one of them on the floor in the bedroom. He does not sleep with a TV on. His neck pain has been better, he gets neck injections under Dr. Ollen Bowl every few months. he has gained weight over time. He particularly struggled with weight gain after bilateral metatarsal fractures.     Observations/Objective:  Generalized: Well developed, in no acute distress  Mentation: Alert oriented to time, place, history taking. Follows all commands speech and language fluent   Assessment and Plan:  55 y.o. year old male  has a past medical history of Allergy, Angina pectoris, Hyperlipidemia, Hypertension, OSA (obstructive sleep apnea), Prediabetes, and RBBB (right bundle branch block). here with  No diagnosis found.    [name] is doing well with CPAP therapy.  Compliance report reveals optimal compliance.  I have encouraged him to continue using CPAP nightly and for greater than 4 hours each night.  We will update supply orders today.  Healthy lifestyle habits encouraged.  He will follow-up with me in 1 year.  Appointment has been set.  He verbalizes understanding and agreement with this plan.  No orders of the defined types were placed in this encounter.   No orders of the defined types were placed in this encounter.    Follow Up Instructions:  I discussed the assessment and treatment plan with the patient. The patient was provided an opportunity to ask questions and all were answered. The patient agreed with the plan and demonstrated an understanding of the instructions.   The patient was advised to call  back or seek an in-person evaluation if the symptoms worsen or if the condition fails to improve as anticipated.  I provided 15 minutes of non-face-to-face time during this encounter. Patient located in his parked [place] during Mychart visit. Provider is in the office.    Shawnie Dapper, NP

## 2020-08-29 ENCOUNTER — Telehealth: Payer: 59 | Admitting: Family Medicine

## 2020-08-29 DIAGNOSIS — G4733 Obstructive sleep apnea (adult) (pediatric): Secondary | ICD-10-CM

## 2020-09-21 NOTE — Patient Instructions (Addendum)

## 2020-09-21 NOTE — Progress Notes (Addendum)
PATIENT: [name] DOB: 07-15-1965  REASON FOR VISIT: follow up HISTORY FROM: patient  Virtual Visit via Telephone Note  I connected with [name] on 09/26/20 at  7:45 AM EDT by telephone and verified that I am speaking with the correct person using two identifiers.   I discussed the limitations, risks, security and privacy concerns of performing an evaluation and management service by telephone and the availability of in person appointments. I also discussed with the patient that there may be a patient responsible charge related to this service. The patient expressed understanding and agreed to proceed.   History of Present Illness:  09/26/20 ALL: [name] returns for CPAP follow up. He is doing very well on CPAP. He denies concerns with machine or supplies. He has had difficulty with recurrent sinus infections since 04/2020. He is planning to have surgery for nasal polyps next week.     08/24/2019 ALL:  [name] is a 54 y.o. male here today for follow up for OSA on CPAP.  He continues to do well on CPAP therapy.  He reports using his machine most every night.  He has had several nights throughout the summer where he has slept on the couch as his wife was recently diagnosed with breast cancer.  He has been helping to care for her post mastectomy.  Otherwise he is feeling well.  Compliance report dated 07/24/2019 through 08/22/2019 reveals that he is used CPAP 28 of the last 30 days for compliance of 93%.  He has used CPAP greater than 4 hours 27 of the past 30 days for compliance of 90%.  Average usage on days used was 5 hours and 50 minutes.  Residual AHI was 0.8 on 7 to 15 cm of water and an EPR of 3.  There was no significant leak noted.   History (copied from my note on 08/20/2018)  [name] is a 55 y.o. male here today for follow up of OSA on CPAP.  [name] is doing very well on CPAP therapy.  He reports that he is sleeping better and feels more energized during the day.   Compliance report dated 07/20/2018 through 08/18/2018 reveals that he is using CPAP nightly for compliance 100%.  22 of the last 30 days he used CPAP greater than 4 hours for compliance of 73%.  Average usage was 4 hours and 50 minutes.  AHI was 1.5 on 7 to 15 cm of water and an EPR of 3.  There was no significant leak.  He returns today for evaluation   HISTORY: (copied from Dr Teofilo Pod note on 04/23/2018)   [name] is a 55 year old right-handed gentleman with an underlying medical history of prediabetes, hypertension, hyperlipidemia, allergies, right bundle branch block, history of angina pectoris and obesity, with whom I am conducting a virtual, video based visit via Webex today, in lieu of a face-to-face visit, for evaluation of his sleep disorder, in particular, concern for underlying obstructive sleep apnea, for re-evaluation. The patient is unaccompanied today and joins via cell phone from home. He is referred by his cardiologist, Dr. Rinaldo Cloud, and I reviewed his note from 02/20/2018.  He reports snoring and a prior diagnosis of moderate obstructive sleep apnea.prior sleep study results are not available for my review today. He estimates that he had a sleep study over 10 years ago, perhaps even 15 years ago. He tried a fullface mask but could not tolerate it. He ended up using nasal pillows and uses CPAP for some  years in the beginning but has not been on it for years. He had discomfort in his neck and because he could not sleep in his bed with the neck pain and ended up sleeping in the recliner he eventually gave up using his CPAP in a few years.   His Epworth sleepiness score is 3 out of 24, fatigue severity score is 18 out of 63. He would be willing to get retested for sleep apnea and consider CPAP therapy again. His blood pressure is now under much better control, a few years ago he had significant difficulty with blood pressure control, he is compliant with his BP medications. His bedtime  is around 8 and rise time around 2:30, he works Adult nurse for the hospital.  He is not aware of any family history of OSA. He had a tonsillectomy as a child. He denies night to night nocturia but does wake up with a headache occasionally, uses either ibuprofen or Excedrin Migraine occasionally. He drinks caffeine in the form of diet soda, 2 per day on average, drinks alcohol occasionally, and is a nonsmoker. He lives with his wife and son. He has an older daughter who is married. He denies restless leg symptoms. They have 2 dogs in the household, one of them sleeps in the bed periodically with them and one of them on the floor in the bedroom. He does not sleep with a TV on. His neck pain has been better, he gets neck injections under Dr. Ollen Bowl every few months. he has gained weight over time. He particularly struggled with weight gain after bilateral metatarsal fractures.     Observations/Objective:  Generalized: Well developed, in no acute distress  Mentation: Alert oriented to time, place, history taking. Follows all commands speech and language fluent   Assessment and Plan:  55 y.o. year old male  has a past medical history of Allergy, Angina pectoris, Hyperlipidemia, Hypertension, OSA (obstructive sleep apnea), Prediabetes, and RBBB (right bundle branch block). here with    ICD-10-CM   1. OSA on CPAP  G47.33 For home use only DME continuous positive airway pressure (CPAP)   Z99.89         [name] is doing well with CPAP therapy.  Compliance report reveals excellent compliance.  I have encouraged him to continue using CPAP nightly and for greater than 4 hours each night.  We will update supply orders today.  Healthy lifestyle habits encouraged.  He will follow-up with me in 1 year.  Appointment has been set.  He verbalizes understanding and agreement with this plan.  Orders Placed This Encounter  Procedures   For home use only DME continuous positive airway pressure (CPAP)     Supplies    Order Specific Question:   Length of Need    Answer:   [employer]    Order Specific Question:   Patient has OSA or probable OSA    Answer:   Yes    Order Specific Question:   Is the patient currently using CPAP in the home    Answer:   Yes    Order Specific Question:   Settings    Answer:   Other see comments    Order Specific Question:   CPAP supplies needed    Answer:   Mask, headgear, cushions, filters, heated tubing and water chamber     No orders of the defined types were placed in this encounter.    Follow Up Instructions:  I discussed the assessment and treatment plan  with the patient. The patient was provided an opportunity to ask questions and all were answered. The patient agreed with the plan and demonstrated an understanding of the instructions.   The patient was advised to call back or seek an in-person evaluation if the symptoms worsen or if the condition fails to improve as anticipated.  I provided 15 minutes of non-face-to-face time during this encounter. Patient located in his parked [place] during Mychart visit. Provider is in the office.    Shawnie Dapper, NP   I reviewed the above note and documentation by the Nurse Practitioner and agree with the history, exam, assessment and plan as outlined above. I was available for consultation. Huston Foley, MD, PhD [facility])

## 2020-09-26 ENCOUNTER — Encounter: Payer: Self-pay | Admitting: Family Medicine

## 2020-09-26 ENCOUNTER — Telehealth: Payer: 59 | Admitting: Family Medicine

## 2020-09-26 DIAGNOSIS — Z9989 Dependence on other enabling machines and devices: Secondary | ICD-10-CM

## 2020-09-26 DIAGNOSIS — G4733 Obstructive sleep apnea (adult) (pediatric): Secondary | ICD-10-CM

## 2020-10-11 NOTE — Progress Notes (Signed)
Surgical Instructions    Your procedure is scheduled on Monday October 10th.  Report to [facility] Main Entrance "A" at 5:30 A.M., then check in with the Admitting office.  Call this number if you have problems the morning of surgery:  [phone]   If you have any questions prior to your surgery date call [phone]: Open Monday-Friday 8am-4pm    Remember:  Do not eat after midnight the night before your surgery  You may drink clear liquids until 4:30am the morning of your surgery.   Clear liquids allowed are: Water, Non-Citrus Juices (without pulp), Carbonated Beverages, Clear Tea, Black Coffee ONLY (NO MILK, CREAM OR POWDERED CREAMER of any kind), and Gatorade    Take these medicines the morning of surgery with A SIP OF WATER amLODipine (NORVASC) 5 MG tablet isosorbide mononitrate (IMDUR) 30 MG 24 hr tablet rosuvastatin (CRESTOR) 10 MG tablet   IF NEEDED  nitroGLYCERIN (NITROSTAT) 0.4 MG SL tablet   Follow your surgeon's instructions on when to stop Aspirin.  If no instructions were given by your surgeon then you will need to call the office to get those instructions.     As of today, STOP taking any Aspirin (unless otherwise instructed by your surgeon) Excedrin Migraine, Aleve, Naproxen, Ibuprofen, Motrin, Advil, Goody's, BC's, all herbal medications, fish oil, and all vitamins.   Notify your provider: if you are in close contact with someone who has COVID  or if you develop a fever of 100.4 or greater, sneezing, cough, sore throat, shortness of breath or body aches.         Do not wear jewelry  Do not wear lotions, powders, colognes, or deodorant. Do not shave 48 hours prior to surgery.  Men may shave face and neck. Do not bring valuables to the hospital. DO Not wear nail polish, gel polish, artificial nails, or any other type of covering on natural nails including finger and toenails. If patients have artificial nails, gel coating, etc. that need to be removed by a  nail salon please have this removed prior to surgery or surgery may need to be canceled/delayed if the surgeon/ anesthesia feels like the patient is unable to be adequately monitored.             [name] is not responsible for any belongings or valuables.  Do NOT Smoke (Tobacco/Vaping)  24 hours prior to your procedure If you use a CPAP at night, you may bring your mask for your overnight stay.   Contacts, glasses, dentures or bridgework may not be worn into surgery, please bring cases for these belongings   For patients admitted to the hospital, discharge time will be determined by your treatment team.   Patients discharged the day of surgery will not be allowed to drive home, and someone needs to stay with them for 24 hours.  NO VISITORS WILL BE ALLOWED IN PRE-OP WHERE PATIENTS ARE PREPPED FOR SURGERY.  ONLY 1 SUPPORT PERSON MAY BE PRESENT IN THE WAITING ROOM WHILE YOU ARE IN SURGERY.  IF YOU ARE TO BE ADMITTED, ONCE YOU ARE IN YOUR ROOM YOU WILL BE ALLOWED TWO (2) VISITORS. 1 (ONE) VISITOR MAY STAY OVERNIGHT BUT MUST ARRIVE TO THE ROOM BY 8pm.  Minor children may have two parents present. Special consideration for safety and communication needs will be reviewed on a case by case basis.  Special instructions:    Oral Hygiene is also important to reduce your risk of infection.  Remember - BRUSH YOUR TEETH  THE MORNING OF SURGERY WITH YOUR REGULAR TOOTHPASTE   [facility]- Preparing For Surgery  Before surgery, you can play an important role. Because skin is not sterile, your skin needs to be as free of germs as possible. You can reduce the number of germs on your skin by washing with CHG (chlorahexidine gluconate) Soap before surgery.  CHG is an antiseptic cleaner which kills germs and bonds with the skin to continue killing germs even after washing.     Please do not use if you have an allergy to CHG or antibacterial soaps. If your skin becomes reddened/irritated stop using the CHG.   Do not shave (including legs and underarms) for at least 48 hours prior to first CHG shower. It is OK to shave your face.  Please follow these instructions carefully.     Shower the NIGHT BEFORE SURGERY and the MORNING OF SURGERY with CHG Soap.   If you chose to wash your hair, wash your hair first as usual with your normal shampoo. After you shampoo, rinse your hair and body thoroughly to remove the shampoo.  Then [employer] and genitals (private parts) with your normal soap and rinse thoroughly to remove soap.  After that Use CHG Soap as you would any other liquid soap. You can apply CHG directly to the skin and wash gently with a scrungie or a clean washcloth.   Apply the CHG Soap to your body ONLY FROM THE NECK DOWN.  Do not use on open wounds or open sores. Avoid contact with your eyes, ears, mouth and genitals (private parts). Wash Face and genitals (private parts)  with your normal soap.   Wash thoroughly, paying special attention to the area where your surgery will be performed.  Thoroughly rinse your body with warm water from the neck down.  DO NOT shower/wash with your normal soap after using and rinsing off the CHG Soap.  Pat yourself dry with a CLEAN TOWEL.  Wear CLEAN PAJAMAS to bed the night before surgery  Place CLEAN SHEETS on your bed the night before your surgery  DO NOT SLEEP WITH PETS.   Day of Surgery:  Take a shower with CHG soap. Wear Clean/Comfortable clothing the morning of surgery Do not apply any deodorants/lotions.   Remember to brush your teeth WITH YOUR REGULAR TOOTHPASTE.   Please read over the following fact sheets that you were given.

## 2020-10-12 ENCOUNTER — Encounter (HOSPITAL_COMMUNITY): Payer: Self-pay

## 2020-10-12 ENCOUNTER — Encounter (HOSPITAL_COMMUNITY)
Admission: RE | Admit: 2020-10-12 | Discharge: 2020-10-12 | Disposition: A | Discharge status: Home / Self Care | Payer: 59 | Source: Ambulatory Visit | Attending: Otolaryngology | Admitting: Otolaryngology

## 2020-10-12 ENCOUNTER — Other Ambulatory Visit: Payer: Self-pay

## 2020-10-12 DIAGNOSIS — Z01818 Encounter for other preprocedural examination: Secondary | ICD-10-CM | POA: Diagnosis not present

## 2020-10-12 DIAGNOSIS — R7303 Prediabetes: Secondary | ICD-10-CM | POA: Insufficient documentation

## 2020-10-12 DIAGNOSIS — J324 Chronic pansinusitis: Secondary | ICD-10-CM | POA: Insufficient documentation

## 2020-10-12 DIAGNOSIS — Z79899 Other long term (current) drug therapy: Secondary | ICD-10-CM | POA: Insufficient documentation

## 2020-10-12 DIAGNOSIS — I10 Essential (primary) hypertension: Secondary | ICD-10-CM | POA: Insufficient documentation

## 2020-10-12 DIAGNOSIS — Z7982 Long term (current) use of aspirin: Secondary | ICD-10-CM | POA: Diagnosis not present

## 2020-10-12 DIAGNOSIS — G4733 Obstructive sleep apnea (adult) (pediatric): Secondary | ICD-10-CM | POA: Diagnosis not present

## 2020-10-12 HISTORY — DX: Pneumonia, unspecified organism: J18.9

## 2020-10-12 LAB — BASIC METABOLIC PANEL
Anion gap: 13 (ref 5–15)
BUN: 14 mg/dL (ref 6–20)
CO2: 24 mmol/L (ref 22–32)
Calcium: 9 mg/dL (ref 8.9–10.3)
Chloride: 99 mmol/L (ref 98–111)
Creatinine, Ser: 0.97 mg/dL (ref 0.61–1.24)
GFR, Estimated: >60 mL/min (ref >60)
Glucose, Bld: 147 mg/dL — ABNORMAL HIGH (ref 70–99)
Potassium: 4.6 mmol/L (ref 3.5–5.1)
Sodium: 136 mmol/L (ref 135–145)

## 2020-10-12 LAB — CBC
HCT: 45.1 % (ref 39.0–52.0)
Hemoglobin: 15.3 g/dL (ref 13.0–17.0)
MCH: 30.1 pg (ref 26.0–34.0)
MCHC: 33.9 g/dL (ref 30.0–36.0)
MCV: 88.6 fL (ref 80.0–100.0)
Platelets: 307 10*3/uL (ref 150–400)
RBC: 5.09 MIL/uL (ref 4.22–5.81)
RDW: 12.5 % (ref 11.5–15.5)
WBC: 11 10*3/uL — ABNORMAL HIGH (ref 4.0–10.5)
nRBC: 0 % (ref 0.0–0.2)

## 2020-10-12 NOTE — Progress Notes (Addendum)
PCP - Dr. Tracey Harries w/ [facility] Cardiologist - Dr. Rinaldo Cloud Neuro: Shawnie Dapper, NP  PPM/ICD - Denies  Chest x-ray - N/A EKG - 10/12/20 Stress Test - 09/06/20 ECHO - Denies Cardiac Cath - Denies  Sleep Study - Yes, patient has OSA. Uses CPAP  Patient denies having diabetes.  Blood Thinner Instructions: N/A Aspirin Instructions: LD 10/08/20  ERAS Protcol - Yes PRE-SURGERY Ensure or G2- No  COVID TEST- N/A  Patients BP before PAT appt was 167/108. BP rechecked after and was 159/115. Patient asymptomatic, but did take his BP meds this am. Tried to call anesthesia PA's, but no answer. Told patient to monitor his BP's more closely and follow up with provider managing BP meds.   Anesthesia review: Yes, cardiac hx; abnormal EKG  Patient denies shortness of breath, fever, cough and chest pain at PAT appointment   All instructions explained to the patient, with a verbal understanding of the material. Patient agrees to go over the instructions while at home for a better understanding. Patient also instructed to self quarantine after being tested for COVID-19. The opportunity to ask questions was provided.

## 2020-10-12 NOTE — Progress Notes (Signed)
Surgical Instructions    Your procedure is scheduled on Monday October 16, 2020 at 7:30 AM.  Report to [facility] Main Entrance "A" at 5:30 A.M., then check in with the Admitting office.  Call this number if you have problems the morning of surgery:  [phone]   If you have any questions prior to your surgery date call [phone]: Open Monday-Friday 8am-4pm    Remember:  Do not eat after midnight the night before your surgery  You may drink clear liquids until 4:30am the morning of your surgery.   Clear liquids allowed are: Water, Non-Citrus Juices (without pulp), Carbonated Beverages, Clear Tea, Black Coffee ONLY (NO MILK, CREAM OR POWDERED CREAMER of any kind), and Gatorade    Take these medicines the morning of surgery with A SIP OF WATER amLODipine (NORVASC) 5 MG tablet isosorbide mononitrate (IMDUR) 30 MG 24 hr tablet rosuvastatin (CRESTOR) 10 MG tablet   IF NEEDED  nitroGLYCERIN (NITROSTAT) 0.4 MG SL tablet   Follow your surgeon's instructions on when to stop Aspirin.  If no instructions were given by your surgeon then you will need to call the office to get those instructions.     As of today, STOP taking any Aspirin (unless otherwise instructed by your surgeon) Excedrin Migraine, Aleve, Naproxen, Ibuprofen, Motrin, Advil, Goody's, BC's, all herbal medications, fish oil, and all vitamins.      Do NOT Smoke (Tobacco/Vaping)  24 hours prior to your procedure If you use a CPAP at night, you may bring your mask for your overnight stay.   Contacts, glasses, dentures or bridgework may not be worn into surgery, please bring cases for these belongings   For patients admitted to the hospital, discharge time will be determined by your treatment team.   Patients discharged the day of surgery will not be allowed to drive home, and someone needs to stay with them for 24 hours.  NO VISITORS WILL BE ALLOWED IN PRE-OP WHERE PATIENTS ARE PREPPED FOR SURGERY.  ONLY 1 SUPPORT  PERSON MAY BE PRESENT IN THE WAITING ROOM WHILE YOU ARE IN SURGERY.  IF YOU ARE TO BE ADMITTED, ONCE YOU ARE IN YOUR ROOM YOU WILL BE ALLOWED TWO (2) VISITORS. 1 (ONE) VISITOR MAY STAY OVERNIGHT BUT MUST ARRIVE TO THE ROOM BY 8pm.  Minor children may have two parents present. Special consideration for safety and communication needs will be reviewed on a case by case basis.  Special instructions:    Oral Hygiene is also important to reduce your risk of infection.  Remember - BRUSH YOUR TEETH THE MORNING OF SURGERY WITH YOUR REGULAR TOOTHPASTE   [facility]- Preparing For Surgery  Before surgery, you can play an important role. Because skin is not sterile, your skin needs to be as free of germs as possible. You can reduce the number of germs on your skin by washing with CHG (chlorahexidine gluconate) Soap before surgery.  CHG is an antiseptic cleaner which kills germs and bonds with the skin to continue killing germs even after washing.     Please do not use if you have an allergy to CHG or antibacterial soaps. If your skin becomes reddened/irritated stop using the CHG.  Do not shave (including legs and underarms) for at least 48 hours prior to first CHG shower. It is OK to shave your face.  Please follow these instructions carefully.     Shower the NIGHT BEFORE SURGERY and the MORNING OF SURGERY with CHG Soap.   If you chose to  wash your hair, wash your hair first as usual with your normal shampoo. After you shampoo, rinse your hair and body thoroughly to remove the shampoo.  Then [employer] and genitals (private parts) with your normal soap and rinse thoroughly to remove soap.  After that Use CHG Soap as you would any other liquid soap. You can apply CHG directly to the skin and wash gently with a scrungie or a clean washcloth.   Apply the CHG Soap to your body ONLY FROM THE NECK DOWN.  Do not use on open wounds or open sores. Avoid contact with your eyes, ears, mouth and genitals (private  parts). Wash Face and genitals (private parts)  with your normal soap.   Wash thoroughly, paying special attention to the area where your surgery will be performed.  Thoroughly rinse your body with warm water from the neck down.  DO NOT shower/wash with your normal soap after using and rinsing off the CHG Soap.  Pat yourself dry with a CLEAN TOWEL.  Wear CLEAN PAJAMAS to bed the night before surgery  Place CLEAN SHEETS on your bed the night before your surgery  DO NOT SLEEP WITH PETS.  Day of Surgery:  Take a shower with CHG soap. Wear Clean/Comfortable clothing the morning of surgery Do not apply any deodorants/lotions.   Remember to brush your teeth WITH YOUR REGULAR TOOTHPASTE.  Do not wear jewelry  Do not wear lotions, powders, colognes, or deodorant. Men may shave face and neck. Do not bring valuables to the hospital.     [facility] is not responsible for any belongings or valuables.   Please read over the following fact sheets that you were given.    3 days prior to your procedure or After your COVID test   You are not required to quarantine however you are required to wear a well-fitting mask when you are out and around people not in your household. If your mask becomes wet or soiled, replace with a new one.   Wash your hands often with soap and water for 20 seconds or clean your hands with an alcohol-based hand sanitizer that contains at least 60% alcohol.   Do not share personal items.   Notify your provider:  o if you are in close contact with someone who has COVID  o or if you develop a fever of 100.4 or greater, sneezing, cough, sore throat, shortness of breath or body aches.

## 2020-10-13 NOTE — Anesthesia Preprocedure Evaluation (Addendum)
Anesthesia Evaluation  Patient identified by MRN, date of birth, ID band Patient awake    Reviewed: Allergy & Precautions, NPO status , Patient's Chart, lab work & pertinent test results  History of Anesthesia Complications Negative for: history of anesthetic complications  Airway Mallampati: II  TM Distance: >3 FB Neck ROM: Full    Dental  (+) Dental Advisory Given, Chipped   Pulmonary sleep apnea and Continuous Positive Airway Pressure Ventilation ,    breath sounds clear to auscultation       Cardiovascular hypertension, Pt. on medications (-) angina Rhythm:Regular Rate:Normal  '21 Stress: EF 70%, borderline inducible ischemia post-septal, low risk scan   Neuro/Psych negative neurological ROS     GI/Hepatic negative GI ROS, Neg liver ROS,   Endo/Other  Morbid obesity  Renal/GU negative Renal ROS     Musculoskeletal   Abdominal (+) + obese,   Peds  Hematology negative hematology ROS (+)   Anesthesia Other Findings   Reproductive/Obstetrics                           Anesthesia Physical Anesthesia Plan  ASA: 3  Anesthesia Plan: General   Post-op Pain Management:    Induction: Intravenous  PONV Risk Score and Plan: 2 and Ondansetron and Dexamethasone  Airway Management Planned: Oral ETT  Additional Equipment: None  Intra-op Plan:   Post-operative Plan: Extubation in OR  Informed Consent: I have reviewed the patients History and Physical, chart, labs and discussed the procedure including the risks, benefits and alternatives for the proposed anesthesia with the patient or authorized representative who has indicated his/her understanding and acceptance.     Dental advisory given  Plan Discussed with: CRNA and Surgeon  Anesthesia Plan Comments: (See APP note by Joslyn Hy, FNP )      Anesthesia Quick Evaluation

## 2020-10-13 NOTE — H&P (Signed)
HPI:   [name] is a 55 y.o. male who presents as a consult Patient.   Referring Provider: Nicholaus Corolla*  Chief complaint: Sinusitis.  HPI: He has had left sinus congestion and foul-smelling discharge for about 4 months. He has been on multiple rounds of antibiotics and prednisone without any relief. He had a CT scan of the sinuses about a month ago. I do not have those films available. He has never had problems like this before.  PMH/Meds/All/SocHx/FamHx/ROS:   Past Medical History:  Diagnosis Date   Hypercholesterolemia   Hypertension   Past Surgical History:  Procedure Laterality Date   HERNIA REPAIR   No family history of bleeding disorders, wound healing problems or difficulty with anesthesia.   Social History   Socioeconomic History   Marital status: Divorced  Spouse name: Not on file   Number of children: Not on file   Years of education: Not on file   Highest education level: Not on file  Occupational History   Not on file  Tobacco Use   Smoking status: Never Smoker   Smokeless tobacco: Never Used  Vaping Use   Vaping Use: Never used  Substance and Sexual Activity   Alcohol use: Yes   Drug use: Never   Sexual activity: Not on file  Other Topics Concern   Not on file  Social History Narrative   Not on file   Social Determinants of Health   Financial Resource Strain: Not on file  Food Insecurity: Not on file  Transportation Needs: Not on file  Physical Activity: Not on file  Stress: Not on file  Social Connections: Not on file  Housing Stability: Not on file   Current Outpatient Medications:   amLODIPine (NORVASC) 10 MG tablet, Take 10 mg by mouth daily., Disp: , Rfl:   aspirin 81 MG chewable tablet *ANTIPLATELET*, Chew 81 mg by mouth., Disp: , Rfl:   azelastine (ASTELIN) 137 mcg (0.1 %) nasal spray, USE 2 SPRAYS IN EACH NOSTRIL TWICE DAILY, Disp: , Rfl:   hydroCHLOROthiazide (HYDRODIURIL) 25 MG tablet, , Disp: , Rfl:   isosorbide  mononitrate ER (IMDUR ER) 30 MG 24 hr tablet, , Disp: , Rfl:   losartan (COZAAR) 100 MG tablet, , Disp: , Rfl:   mometasone (NASONEX) 50 mcg/actuation nasal spray, SHAKE LIQUID AND USE 2 SPRAYS IN EACH NOSTRIL DAILY, Disp: , Rfl:   rosuvastatin (CRESTOR) 10 MG tablet, Take 10 mg by mouth daily., Disp: , Rfl:   A complete ROS was performed with pertinent positives/negatives noted in the HPI. The remainder of the ROS are negative.   Physical Exam:   BP 172/89  Pulse 97  Temp (!) 96.6 F (35.9 C)  Ht 1.778 m (5\' 10" )  Wt 136.1 kg (300 lb)  BMI 43.05 kg/m   General: Obese gentleman, in no distress, breathing easily. Normal affect. In a pleasant mood. Head: Normocephalic, atraumatic. No masses, or scars. Eyes: Pupils are equal, and reactive to light. Vision is grossly intact. No spontaneous or gaze nystagmus. Ears: Ear canals are clear. Tympanic membranes are intact, with normal landmarks and the middle ears are clear and healthy. Hearing: Grossly normal. Nose: Nasal cavities are clear with healthy mucosa, on the right side, edema and purulent secretions filling the left nasal cavity up to the infundibulum. Face: No masses or scars, facial nerve function is symmetric. Oral Cavity: No mucosal abnormalities are noted. Tongue with normal mobility. Dentition appears healthy. Oropharynx: Tonsils are symmetric. There are no mucosal masses  identified. Tongue base appears normal and healthy. Larynx/Hypopharynx: deferred Chest: Deferred Neck: No palpable masses, no cervical adenopathy, no thyroid nodules or enlargement. Neuro: Cranial nerves II-XII with normal function. Balance: Normal gate. Other findings: none.  Independent Review of Additional Tests or Records:  none  Procedures:  none  Impression & Plans:  Chronic unilateral sinusitis. I am going to obtain the CT scan to review but it sounds as though he is going to need endoscopic sinus surgery. Risks and benefits were discussed in  detail. All questions were answered. We will set this up as soon as possible.

## 2020-10-13 NOTE — Progress Notes (Signed)
Anesthesia Chart Review:   Case: [id number] Date/Time: 10/16/20 0715   Procedure: LEFT ENDOSCOPIC ETHMOID AND MAXILLARY SINUS SURGERY (Left)   Anesthesia type: General   Pre-op diagnosis: Chronic pansinusitis   [facility] ROOM 09 / [facility]   Surgeons: [name], MD       DISCUSSION: Pt is 55 years old with hx HTN, prediabetes, OSA  BP at pre-admission testing was elevated at 167/108 and 159/115 on recheck. Pt reports he is taking his medicine as prescribed. Denies headache, dizziness, syncope, chest pain. BP not normally this high but pt reports he was uncomfortable at pre-admission testing (needed to use bathroom urgently). Review of BP from Dr. Lucky Rathke note (172/89) and from Dr. Annitta Jersey note (137/83) indicate diastolic BP not usually this high. Reviewed pre-op BP med instructions    VS: BP (!) 167/108   Pulse 91   Temp 36.9 C (Oral)   Resp 18   Ht 5\' 10"  (1.778 m)   Wt (!) 138.5 kg   SpO2 98%   BMI 43.82 kg/m   PROVIDERS: - PCP is , MD (notes in care everywhere)  - Cardiologist is Tracey Harries, MD   LABS: Labs reviewed: Acceptable for surgery. (all labs ordered are listed, but only abnormal results are displayed)  Labs Reviewed  BASIC METABOLIC PANEL - Abnormal; Notable for the following components:      Result Value   Glucose, Bld 147 (*)    All other components within normal limits  CBC - Abnormal; Notable for the following components:   WBC 11.0 (*)    All other components within normal limits    EKG 10/12/20: NSR. RBBB. LAFB. Bifascicular block   CV: Nuclear stress test 09/08/19:  1. Mild reduction in stress activity in a small segment in the posterior-septal left ventricular wall in the mid heart approaching the base, borderline for inducible ischemia. Apical thinning versus mild apical scarring. 2. Normal left ventricular wall motion. 3. Left ventricular ejection fraction 70% 4. Non invasive risk stratification*: Low   Past Medical  History:  Diagnosis Date   Allergy    Angina pectoris    Hyperlipidemia    Hypertension    OSA (obstructive sleep apnea)    Pneumonia    Prediabetes    RBBB (right bundle branch block)     Past Surgical History:  Procedure Laterality Date   HAND SURGERY     left   HERNIA REPAIR     INGUINAL HERNIA REPAIR     right   TONSILLECTOMY      MEDICATIONS:  amLODipine (NORVASC) 5 MG tablet   aspirin 81 MG tablet   aspirin-acetaminophen-caffeine (EXCEDRIN MIGRAINE) 250-250-65 MG tablet   hydrochlorothiazide (HYDRODIURIL) 25 MG tablet   ibuprofen (ADVIL) 200 MG tablet   isosorbide mononitrate (IMDUR) 30 MG 24 hr tablet   losartan (COZAAR) 100 MG tablet   nitroGLYCERIN (NITROSTAT) 0.4 MG SL tablet   Omega-3 Fatty Acids (FISH OIL) 1200 MG CAPS   rosuvastatin (CRESTOR) 10 MG tablet   No current facility-administered medications for this encounter.    If BP acceptable day of surgery, I anticipate pt can proceed with surgery as scheduled.  11/08/19, PhD, FNP-BC [facility]/Anesthesiology Phone: [phone] 10/13/2020 10:45 AM

## 2020-10-16 ENCOUNTER — Encounter (HOSPITAL_COMMUNITY): Payer: Self-pay | Admitting: Otolaryngology

## 2020-10-16 ENCOUNTER — Ambulatory Visit (HOSPITAL_COMMUNITY): Payer: 59 | Admitting: Emergency Medicine

## 2020-10-16 ENCOUNTER — Observation Stay (HOSPITAL_COMMUNITY)
Admission: RE | Admit: 2020-10-16 | Discharge: 2020-10-16 | Disposition: A | Discharge status: Home / Self Care | Payer: 59 | Attending: Otolaryngology | Admitting: Otolaryngology

## 2020-10-16 ENCOUNTER — Other Ambulatory Visit: Payer: Self-pay

## 2020-10-16 ENCOUNTER — Ambulatory Visit (HOSPITAL_COMMUNITY): Payer: 59 | Admitting: Anesthesiology

## 2020-10-16 ENCOUNTER — Encounter (HOSPITAL_COMMUNITY)
Admission: RE | Disposition: A | Discharge status: Home / Self Care | Payer: Self-pay | Source: Home / Self Care | Attending: Otolaryngology

## 2020-10-16 DIAGNOSIS — I10 Essential (primary) hypertension: Secondary | ICD-10-CM | POA: Insufficient documentation

## 2020-10-16 DIAGNOSIS — J329 Chronic sinusitis, unspecified: Secondary | ICD-10-CM | POA: Diagnosis present

## 2020-10-16 DIAGNOSIS — J324 Chronic pansinusitis: Secondary | ICD-10-CM | POA: Diagnosis present

## 2020-10-16 DIAGNOSIS — Z79899 Other long term (current) drug therapy: Secondary | ICD-10-CM | POA: Insufficient documentation

## 2020-10-16 DIAGNOSIS — Z7982 Long term (current) use of aspirin: Secondary | ICD-10-CM | POA: Insufficient documentation

## 2020-10-16 HISTORY — PX: NASAL SINUS SURGERY: SHX719

## 2020-10-16 SURGERY — SINUS SURGERY, ENDOSCOPIC
Anesthesia: General | Site: Nose | Laterality: Left

## 2020-10-16 MED ORDER — SUGAMMADEX SODIUM 200 MG/2ML IV SOLN
INTRAVENOUS | Status: DC | PRN
Start: 2020-10-16 — End: 2020-10-16
  Administered 2020-10-16: 280 mg via INTRAVENOUS

## 2020-10-16 MED ORDER — OXYMETAZOLINE HCL 0.05 % NA SOLN
NASAL | Status: DC | PRN
  Administered 2020-10-16: 1 via TOPICAL

## 2020-10-16 MED ORDER — LIDOCAINE-EPINEPHRINE 1 %-1:100000 IJ SOLN
INTRAMUSCULAR | Status: AC
  Filled 2020-10-16: qty 1

## 2020-10-16 MED ORDER — FENTANYL CITRATE (PF) 250 MCG/5ML IJ SOLN
INTRAMUSCULAR | Status: AC
  Filled 2020-10-16: qty 5

## 2020-10-16 MED ORDER — LACTATED RINGERS IV SOLN: INTRAVENOUS | Status: DC

## 2020-10-16 MED ORDER — HYDROCODONE-ACETAMINOPHEN 5-325 MG PO TABS: 1.0000 | ORAL_TABLET | ORAL | Status: DC | PRN

## 2020-10-16 MED ORDER — SODIUM CHLORIDE 0.9 % IR SOLN
Status: DC | PRN
  Administered 2020-10-16: 1000 mL

## 2020-10-16 MED ORDER — LIDOCAINE 2% (20 MG/ML) 5 ML SYRINGE
INTRAMUSCULAR | Status: DC | PRN
  Administered 2020-10-16: 40 mg via INTRAVENOUS

## 2020-10-16 MED ORDER — LIDOCAINE-EPINEPHRINE 1 %-1:100000 IJ SOLN
INTRAMUSCULAR | Status: DC | PRN
  Administered 2020-10-16: 4 mL

## 2020-10-16 MED ORDER — DEXAMETHASONE SODIUM PHOSPHATE 10 MG/ML IJ SOLN
INTRAMUSCULAR | Status: DC | PRN
  Administered 2020-10-16: 10 mg via INTRAVENOUS

## 2020-10-16 MED ORDER — ONDANSETRON HCL 4 MG/2ML IJ SOLN
INTRAMUSCULAR | Status: AC
  Filled 2020-10-16: qty 2

## 2020-10-16 MED ORDER — ACETAMINOPHEN 500 MG PO TABS
1000.0000 mg | ORAL_TABLET | Freq: Once | ORAL | Status: AC
  Administered 2020-10-16: 1000 mg via ORAL
  Filled 2020-10-16: qty 2

## 2020-10-16 MED ORDER — MIDAZOLAM HCL 2 MG/2ML IJ SOLN
INTRAMUSCULAR | Status: AC
  Filled 2020-10-16: qty 2

## 2020-10-16 MED ORDER — PROMETHAZINE HCL 25 MG RE SUPP: 25.0000 mg | Freq: Four times a day (QID) | RECTAL | 1 refills | Status: AC | PRN

## 2020-10-16 MED ORDER — PROMETHAZINE HCL 25 MG/ML IJ SOLN: 6.2500 mg | INTRAMUSCULAR | Status: DC | PRN

## 2020-10-16 MED ORDER — MEPERIDINE HCL 25 MG/ML IJ SOLN: 6.2500 mg | INTRAMUSCULAR | Status: DC | PRN

## 2020-10-16 MED ORDER — DEXAMETHASONE SODIUM PHOSPHATE 10 MG/ML IJ SOLN
INTRAMUSCULAR | Status: AC
  Filled 2020-10-16: qty 1

## 2020-10-16 MED ORDER — OXYMETAZOLINE HCL 0.05 % NA SOLN
2.0000 | NASAL | Status: DC
  Administered 2020-10-16: 2 via NASAL
  Filled 2020-10-16: qty 30

## 2020-10-16 MED ORDER — CHLORHEXIDINE GLUCONATE 0.12 % MT SOLN
15.0000 mL | Freq: Once | OROMUCOSAL | Status: AC
  Administered 2020-10-16: 15 mL via OROMUCOSAL
  Filled 2020-10-16: qty 15

## 2020-10-16 MED ORDER — ROCURONIUM BROMIDE 10 MG/ML (PF) SYRINGE
PREFILLED_SYRINGE | INTRAVENOUS | Status: AC
  Filled 2020-10-16: qty 10

## 2020-10-16 MED ORDER — ONDANSETRON HCL 4 MG/2ML IJ SOLN
INTRAMUSCULAR | Status: DC | PRN
  Administered 2020-10-16: 4 mg via INTRAVENOUS

## 2020-10-16 MED ORDER — PROPOFOL 10 MG/ML IV BOLUS
INTRAVENOUS | Status: DC | PRN
  Administered 2020-10-16: 200 mg via INTRAVENOUS

## 2020-10-16 MED ORDER — MIDAZOLAM HCL 5 MG/5ML IJ SOLN
INTRAMUSCULAR | Status: DC | PRN
  Administered 2020-10-16: 2 mg via INTRAVENOUS

## 2020-10-16 MED ORDER — FENTANYL CITRATE (PF) 250 MCG/5ML IJ SOLN
INTRAMUSCULAR | Status: DC | PRN
  Administered 2020-10-16 (×2): 100 ug via INTRAVENOUS
  Administered 2020-10-16: 50 ug via INTRAVENOUS

## 2020-10-16 MED ORDER — 0.9 % SODIUM CHLORIDE (POUR BTL) OPTIME
TOPICAL | Status: DC | PRN
  Administered 2020-10-16: 1000 mL

## 2020-10-16 MED ORDER — OXYCODONE HCL 5 MG/5ML PO SOLN
5.0000 mg | Freq: Once | ORAL | Status: DC | PRN
Start: 2020-10-16 — End: 2020-10-16

## 2020-10-16 MED ORDER — LIDOCAINE 2% (20 MG/ML) 5 ML SYRINGE
INTRAMUSCULAR | Status: AC
  Filled 2020-10-16: qty 5

## 2020-10-16 MED ORDER — HYDROCODONE-ACETAMINOPHEN 7.5-325 MG PO TABS: 1.0000 | ORAL_TABLET | Freq: Four times a day (QID) | ORAL | 0 refills | Status: AC | PRN

## 2020-10-16 MED ORDER — BACITRACIN ZINC 500 UNIT/GM EX OINT
TOPICAL_OINTMENT | CUTANEOUS | Status: AC
  Filled 2020-10-16: qty 28.35

## 2020-10-16 MED ORDER — OXYCODONE HCL 5 MG PO TABS: 5.0000 mg | ORAL_TABLET | Freq: Once | ORAL | Status: DC | PRN

## 2020-10-16 MED ORDER — MIDAZOLAM HCL 2 MG/2ML IJ SOLN: 0.5000 mg | Freq: Once | INTRAMUSCULAR | Status: DC | PRN

## 2020-10-16 MED ORDER — HYDROMORPHONE HCL 1 MG/ML IJ SOLN
0.2500 mg | INTRAMUSCULAR | Status: DC | PRN
  Administered 2020-10-16: 0.25 mg via INTRAVENOUS

## 2020-10-16 MED ORDER — HYDROMORPHONE HCL 1 MG/ML IJ SOLN
INTRAMUSCULAR | Status: AC
  Filled 2020-10-16: qty 1

## 2020-10-16 MED ORDER — ROCURONIUM BROMIDE 10 MG/ML (PF) SYRINGE
PREFILLED_SYRINGE | INTRAVENOUS | Status: DC | PRN
  Administered 2020-10-16: 10 mg via INTRAVENOUS
  Administered 2020-10-16: 50 mg via INTRAVENOUS
  Administered 2020-10-16: 10 mg via INTRAVENOUS

## 2020-10-16 MED ORDER — PROPOFOL 10 MG/ML IV BOLUS
INTRAVENOUS | Status: AC
  Filled 2020-10-16: qty 20

## 2020-10-16 MED ORDER — ORAL CARE MOUTH RINSE: 15.0000 mL | Freq: Once | OROMUCOSAL | Status: AC

## 2020-10-16 SURGICAL SUPPLY — 34 items
BAG COUNTER SPONGE SURGICOUNT (BAG) ×2 IMPLANT
BAG SPNG CNTER NS LX DISP (BAG) ×1
BLADE RAD40 ROTATE 4M 4 5PK (BLADE) IMPLANT
BLADE RAD60 ROTATE M4 4 5PK (BLADE) IMPLANT
BLADE TRICUT ROTATE M4 4 5PK (BLADE) ×2 IMPLANT
CANISTER SUCT 3000ML PPV (MISCELLANEOUS) ×2 IMPLANT
DRAPE HALF SHEET 40X57 (DRAPES) IMPLANT
DRESSING NASAL KENNEDY 3.5X.9 (MISCELLANEOUS) IMPLANT
DRSG NASAL KENNEDY 3.5X.9 (MISCELLANEOUS)
DRSG NASOPORE 8CM (GAUZE/BANDAGES/DRESSINGS) ×1 IMPLANT
ELECT REM PT RETURN 9FT ADLT (ELECTROSURGICAL)
ELECTRODE REM PT RTRN 9FT ADLT (ELECTROSURGICAL) IMPLANT
FILTER ARTHROSCOPY CONVERTOR (FILTER) IMPLANT
GLOVE SURG LTX SZ7.5 (GLOVE) ×2 IMPLANT
GOWN STRL REUS W/ TWL LRG LVL3 (GOWN DISPOSABLE) ×2 IMPLANT
GOWN STRL REUS W/TWL LRG LVL3 (GOWN DISPOSABLE) ×4
KIT BASIN OR (CUSTOM PROCEDURE TRAY) ×2 IMPLANT
KIT TURNOVER KIT B (KITS) ×2 IMPLANT
NDL PRECISIONGLIDE 27X1.5 (NEEDLE) ×1 IMPLANT
NEEDLE PRECISIONGLIDE 27X1.5 (NEEDLE) ×2 IMPLANT
NS IRRIG 1000ML POUR BTL (IV SOLUTION) ×2 IMPLANT
PAD ARMBOARD 7.5X6 YLW CONV (MISCELLANEOUS) ×4 IMPLANT
PATTIES SURGICAL .5 X3 (DISPOSABLE) ×2 IMPLANT
SHEATH ENDOSCRUB 0 DEG (SHEATH) IMPLANT
SHEATH ENDOSCRUB 30 DEG (SHEATH) IMPLANT
SPECIMEN JAR SMALL (MISCELLANEOUS) ×2 IMPLANT
SWAB COLLECTION DEVICE MRSA (MISCELLANEOUS) IMPLANT
SWAB CULTURE ESWAB REG 1ML (MISCELLANEOUS) IMPLANT
SYR 50ML SLIP (SYRINGE) IMPLANT
TOWEL GREEN STERILE FF (TOWEL DISPOSABLE) ×2 IMPLANT
TRAY ENT MC OR (CUSTOM PROCEDURE TRAY) ×2 IMPLANT
TUBE CONNECTING 12X1/4 (SUCTIONS) ×2 IMPLANT
TUBING EXTENTION W/L.L. (IV SETS) IMPLANT
WATER STERILE IRR 1000ML POUR (IV SOLUTION) ×2 IMPLANT

## 2020-10-16 NOTE — Progress Notes (Signed)
Informed Dr. Jean Rosenthal of BP 154/100.  No new order.

## 2020-10-16 NOTE — Interval H&P Note (Signed)
History and Physical Interval Note:  10/16/2020 6:58 AM  [name]  has presented today for surgery, with the diagnosis of Chronic pansinusitis.  The various methods of treatment have been discussed with the patient and family. After consideration of risks, benefits and other options for treatment, the patient has consented to  Procedure(s): LEFT ENDOSCOPIC ETHMOID AND MAXILLARY SINUS SURGERY (Left) as a surgical intervention.  The patient's history has been reviewed, patient examined, no change in status, stable for surgery.  I have reviewed the patient's chart and labs.  Questions were answered to the patient's satisfaction.     [name]

## 2020-10-16 NOTE — Transfer of Care (Signed)
Immediate Anesthesia Transfer of Care Note  Patient: [name]  Procedure(s) Performed: LEFT ENDOSCOPIC ETHMOID AND MAXILLARY SINUS SURGERY (Left: Nose)  Patient Location: PACU  Anesthesia Type:General  Level of Consciousness: awake, alert  and oriented  Airway & Oxygen Therapy: Patient Spontanous Breathing and Patient connected to face mask oxygen  Post-op Assessment: Report given to RN, Post -op Vital signs reviewed and stable, Patient moving all extremities X 4 and Patient able to stick tongue midline  Post vital signs: stable  Last Vitals:  Vitals Value Taken Time  BP 147/95 10/16/20 0841  Temp 98.6   Pulse 88 10/16/20 0842  Resp 21 10/16/20 0842  SpO2 96 % 10/16/20 0842  Vitals shown include unvalidated device data.  Last Pain:  Vitals:   10/16/20 0623  TempSrc:   PainSc: 6          Complications: No notable events documented.

## 2020-10-16 NOTE — Op Note (Signed)
OPERATIVE REPORT  DATE OF SURGERY: 10/16/2020  PATIENT:  [name],  55 y.o. male  PRE-OPERATIVE DIAGNOSIS:  Chronic pansinusitis  POST-OPERATIVE DIAGNOSIS:  Chronic pansinusitis  PROCEDURE:  Procedure(s): LEFT ENDOSCOPIC FRONTAL, ETHMOID AND MAXILLARY SINUS SURGERY  SURGEON:  Susy Frizzle, MD  ASSISTANTS: None  ANESTHESIA:   General   EBL: 150 ml  DRAINS: None  LOCAL MEDICATIONS USED: 1% Xylocaine with epinephrine  SPECIMEN: Left nasal and sinus contents  COUNTS:  Correct  PROCEDURE DETAILS: The patient was taken to the operating room and placed on the operating table in the supine position. Following induction of general endotracheal anesthesia, face was draped in standard fashion for sinus surgery.  Afrin spray was used preoperatively in the nasal cavities.  1% Xylocaine with epinephrine was used to infiltrate into the superior and posterior attachments of the left middle turbinate and the lateral nasal wall.  There was soft tissue mass present within the infundibulum on the left.  There were purulent secretions draining around this.  1.  Left endoscopic total ethmoidectomy.  Starting with the infundibulum the soft tissue mass was debrided using the microdebrider opening up the infundibulum and then back into the bulla ethmoidalis.  A complete ethmoid dissection was then accomplished using the microdebrider and a variety of angled forceps to remove polypoid disease.  There is diffuse polypoid disease present throughout the anterior and posterior cells.  The lamina papyracea was intact laterally.  The fovea was intact superiorly.  The ground lamella was taken down to expose posterior cells.  The turbinate remnant was kept intact otherwise.  Complete ethmoid dissection was accomplished.  At the end of the procedure and a half of a NasalPore dressing was packed into the left ethmoid cavity.  2.  Left endoscopic maxillary antrostomy with removal of tissue.  After the bulla was  opened the fontanelle was inspected with a curved suction and the maxillary antrum was entered.  The antrostomy was enlarged anteriorly using the backbiting forceps and posteriorly using Tru-Cut forceps.  The microdebrider was used inferiorly.  There is diffuse polypoid tissue throughout the antrum which was mostly removed using a forceps.  The maxillary sinus was irrigated with saline at the end of the case.  3.  Left frontal sinus exploration.  After the ethmoid dissection was completed the frontal recess was inspected.  It was filled with polypoid disease.  Using 30 degree endoscope and giraffe forceps the polypoid disease was removed.  The natural ostium and drainage pathway were small but were felt to be unobstructed at this point.  The pharynx was suctioned blood and secretions.  The patient was awakened extubated and transferred to recovery in stable condition peer    PATIENT DISPOSITION:  To PACU, stable

## 2020-10-16 NOTE — Anesthesia Procedure Notes (Signed)
Procedure Name: Intubation Date/Time: 10/16/2020 7:41 AM Performed by: Cy Blamer, CRNA Pre-anesthesia Checklist: Patient identified, Emergency Drugs available, Suction available and Patient being monitored Patient Re-evaluated:Patient Re-evaluated prior to induction Oxygen Delivery Method: Circle system utilized Preoxygenation: Pre-oxygenation with 100% oxygen Induction Type: IV induction Ventilation: Two handed mask ventilation required Laryngoscope Size: Glidescope and 3 Grade View: Grade I Tube type: Oral Tube size: 7.5 mm Number of attempts: 2 Airway Equipment and Method: Stylet Placement Confirmation: ETT inserted through vocal cords under direct vision, positive ETCO2 and breath sounds checked- equal and bilateral Secured at: 23 cm Tube secured with: Tape Dental Injury: Teeth and Oropharynx as per pre-operative assessment  Comments: 1st attempt Miller 2, Grade III view w/BURP, limited AOJ extension

## 2020-10-16 NOTE — Discharge Instructions (Signed)
Avoid nose blowing for 2-3 weeks.  Avoid any heavy physical activity such as lifting more than 15 pounds also for 2 weeks.  Use nasal saline spray 20-30 times daily.  Bruising around left eye will dissipate over the next week or 2.  It may get worse before he gets better.

## 2020-10-16 NOTE — Anesthesia Postprocedure Evaluation (Signed)
Anesthesia Post Note  Patient: [name]  Procedure(s) Performed: LEFT ENDOSCOPIC ETHMOID AND MAXILLARY SINUS SURGERY (Left: Nose)     Patient location during evaluation: PACU Anesthesia Type: General Level of consciousness: awake and alert, patient cooperative and oriented Pain management: pain level controlled Vital Signs Assessment: post-procedure vital signs reviewed and stable Respiratory status: spontaneous breathing, nonlabored ventilation, respiratory function stable and patient connected to face mask oxygen Cardiovascular status: blood pressure returned to baseline and stable Postop Assessment: no apparent nausea or vomiting Anesthetic complications: no   No notable events documented.  Last Vitals:  Vitals:   10/16/20 0855 10/16/20 0900  BP: (!) 124/91   Pulse: 88 86  Resp: (!) 23 12  Temp:    SpO2: 90% 92%    Last Pain:  Vitals:   10/16/20 0840  TempSrc:   PainSc: 5                  [name],E. Gissela Bloch

## 2020-10-17 ENCOUNTER — Encounter (HOSPITAL_COMMUNITY): Payer: Self-pay | Admitting: Otolaryngology

## 2020-10-17 LAB — SURGICAL PATHOLOGY

## 2020-10-20 ENCOUNTER — Emergency Department (HOSPITAL_COMMUNITY): Payer: 59

## 2020-10-20 ENCOUNTER — Emergency Department (HOSPITAL_COMMUNITY)
Admission: EM | Admit: 2020-10-20 | Discharge: 2020-10-20 | Disposition: A | Discharge status: Home / Self Care | Payer: 59 | Attending: Emergency Medicine | Admitting: Emergency Medicine

## 2020-10-20 ENCOUNTER — Other Ambulatory Visit: Payer: Self-pay

## 2020-10-20 DIAGNOSIS — R42 Dizziness and giddiness: Secondary | ICD-10-CM | POA: Diagnosis not present

## 2020-10-20 DIAGNOSIS — R4182 Altered mental status, unspecified: Secondary | ICD-10-CM | POA: Diagnosis not present

## 2020-10-20 DIAGNOSIS — Z7982 Long term (current) use of aspirin: Secondary | ICD-10-CM | POA: Insufficient documentation

## 2020-10-20 DIAGNOSIS — R110 Nausea: Secondary | ICD-10-CM | POA: Insufficient documentation

## 2020-10-20 DIAGNOSIS — R519 Headache, unspecified: Secondary | ICD-10-CM | POA: Diagnosis not present

## 2020-10-20 DIAGNOSIS — I10 Essential (primary) hypertension: Secondary | ICD-10-CM | POA: Diagnosis not present

## 2020-10-20 DIAGNOSIS — R059 Cough, unspecified: Secondary | ICD-10-CM | POA: Diagnosis not present

## 2020-10-20 DIAGNOSIS — Z79899 Other long term (current) drug therapy: Secondary | ICD-10-CM | POA: Diagnosis not present

## 2020-10-20 DIAGNOSIS — R0602 Shortness of breath: Secondary | ICD-10-CM | POA: Diagnosis not present

## 2020-10-20 DIAGNOSIS — R0789 Other chest pain: Secondary | ICD-10-CM | POA: Diagnosis not present

## 2020-10-20 DIAGNOSIS — R109 Unspecified abdominal pain: Secondary | ICD-10-CM | POA: Insufficient documentation

## 2020-10-20 LAB — CBC
HCT: 45.1 % (ref 39.0–52.0)
Hemoglobin: 15.4 g/dL (ref 13.0–17.0)
MCH: 30.2 pg (ref 26.0–34.0)
MCHC: 34.1 g/dL (ref 30.0–36.0)
MCV: 88.4 fL (ref 80.0–100.0)
Platelets: 338 10*3/uL (ref 150–400)
RBC: 5.1 MIL/uL (ref 4.22–5.81)
RDW: 12.6 % (ref 11.5–15.5)
WBC: 12.4 10*3/uL — ABNORMAL HIGH (ref 4.0–10.5)
nRBC: 0 % (ref 0.0–0.2)

## 2020-10-20 LAB — BASIC METABOLIC PANEL
Anion gap: 10 (ref 5–15)
BUN: 16 mg/dL (ref 6–20)
CO2: 24 mmol/L (ref 22–32)
Calcium: 9.3 mg/dL (ref 8.9–10.3)
Chloride: 100 mmol/L (ref 98–111)
Creatinine, Ser: 0.93 mg/dL (ref 0.61–1.24)
GFR, Estimated: >60 mL/min (ref >60)
Glucose, Bld: 220 mg/dL — ABNORMAL HIGH (ref 70–99)
Potassium: 3.8 mmol/L (ref 3.5–5.1)
Sodium: 134 mmol/L — ABNORMAL LOW (ref 135–145)

## 2020-10-20 LAB — TROPONIN I (HIGH SENSITIVITY)
Troponin I (High Sensitivity): 9 ng/L (ref <18)
Troponin I (High Sensitivity): 10 ng/L (ref <18)

## 2020-10-20 MED ORDER — IOHEXOL 350 MG/ML SOLN
100.0000 mL | Freq: Once | INTRAVENOUS | Status: AC | PRN
  Administered 2020-10-20: 100 mL via INTRAVENOUS

## 2020-10-20 NOTE — ED Notes (Signed)
Patient transported to CT 

## 2020-10-20 NOTE — ED Triage Notes (Signed)
Pt. Stated, [name] had some SOB and some chest pain a little with some mental problem. I was using bathroom and didn't remember were I was at. When I stand I get light headed.  I had surgery in my nose to remove some polyps on Monday.------------------

## 2020-10-20 NOTE — Discharge Summary (Signed)
Physician Discharge Summary  Patient ID: [name] MRN: [id number] DOB/AGE: 10-01-1965 55 y.o.  Admit date: 10/16/2020 Discharge date: 10/20/2020  Admission Diagnoses:chronic sinusitis  Discharge Diagnoses:  Active Problems:   Chronic sinusitis   Discharged Condition: good  Hospital Course: no complications  Consults: none  Significant Diagnostic Studies: none  Treatments: surgery: endoscopic sinus surgery  Discharge Exam: Blood pressure 138/86, pulse 79, temperature 98.6 F (37 C), resp. rate (!) 23, height 5\' 10"  (1.778 m), weight 136.1 kg, SpO2 93 %. PHYSICAL EXAM: Awake and alert. Slight ecchymosis left medial upper eyelid. No bleeding   Disposition:    Allergies as of 10/16/2020       Reactions   Other Rash   Liquid fabric softner   Penicillins Rash        Medication List     TAKE these medications    amLODipine 5 MG tablet Commonly known as: NORVASC Take 1 tablet (5 mg total) by mouth daily.   aspirin 81 MG tablet Take 81 mg by mouth daily.   aspirin-acetaminophen-caffeine 250-250-65 MG tablet Commonly known as: EXCEDRIN MIGRAINE Take 2 tablets by mouth every 6 (six) hours as needed (Sinus).   Fish Oil 1200 MG Caps Take 1 capsule by mouth daily.   hydrochlorothiazide 25 MG tablet Commonly known as: HYDRODIURIL Take 25 mg by mouth daily.   HYDROcodone-acetaminophen 7.5-325 MG tablet Commonly known as: Norco Take 1 tablet by mouth every 6 (six) hours as needed for moderate pain.   ibuprofen 200 MG tablet Commonly known as: ADVIL Take 800 mg by mouth every 6 (six) hours as needed for mild pain or moderate pain.   isosorbide mononitrate 30 MG 24 hr tablet Commonly known as: IMDUR Take 1 tablet (30 mg total) by mouth daily.   losartan 100 MG tablet Commonly known as: COZAAR Take 100 mg by mouth daily.   nitroGLYCERIN 0.4 MG SL tablet Commonly known as: NITROSTAT Place 1 tablet (0.4 mg total) under the tongue every 5 (five)  minutes x 3 doses as needed for chest pain.   promethazine 25 MG suppository Commonly known as: PHENERGAN Place 1 suppository (25 mg total) rectally every 6 (six) hours as needed for nausea or vomiting.   rosuvastatin 10 MG tablet Commonly known as: CRESTOR Take 10 mg by mouth daily.         Signed: 05-07-1978 10/20/2020, 9:42 AM

## 2020-10-20 NOTE — Discharge Instructions (Addendum)
Your testing today showed that you were not having a heart attack. Your CT scan showed that there was no pulmonary embolus (clot in your lung) and your labs did not show any anemia with your bleeding.   Shortness of breath and this chest pain can be related to the heart even though you did not have anything concerning by our exam today and we recommend that you follow up with cardiology to see if you need a stress test. If your symptoms continue also talk with your primary care provider.   Also, your sugar was elevated in the ER and we recommend your primary care provider screen for diabetes and check on your blood pressure.  For your pain management, I would touch base with your surgeon and see if they have any preference regarding pain control for your headaches.

## 2020-10-20 NOTE — ED Provider Notes (Signed)
[facility] EMERGENCY DEPARTMENT Provider Note   CSN: [id number] Arrival date & time: 10/20/20  4481     History Chief Complaint  Patient presents with   Chest Pain   Shortness of Breath   Altered Mental Status    [name] is a 55 y.o. male presenting for chest pain with shortness of breath since yesterday. He recently had a procedure for nasal polyps on 10/10. Since the procedure he has been having elevated blood pressure with headache and some cough. He notes that the cough worsened yesterday and he began having right sided chest pain that felt like "someone was poking" the area. At about 1am, he was sitting up in a chair watching TV and had a sudden episode of shortness of breath that made him gasp for air. He and his wife state that he has had episodes of dizziness and disorientation. He has also had extreme fatigue since yesterday where he slept all day and still was tired when awake, which is abnormal for him.   Chest Pain Associated symptoms: abdominal pain, altered mental status, dizziness, fatigue, headache, nausea and shortness of breath   Associated symptoms: no back pain, no fever and no vomiting   Shortness of Breath Associated symptoms: abdominal pain, chest pain and headaches   Associated symptoms: no fever and no vomiting   Altered Mental Status Presenting symptoms: confusion (intermittent, not currently confused)   Associated symptoms: abdominal pain, headaches and nausea   Associated symptoms: no fever and no vomiting       Past Medical History:  Diagnosis Date   Allergy    Angina pectoris    Hyperlipidemia    Hypertension    OSA (obstructive sleep apnea)    Pneumonia    Prediabetes    RBBB (right bundle branch block)     Patient Active Problem List   Diagnosis Date Noted   Chronic sinusitis 10/16/2020   Acute coronary syndrome (HCC) 09/07/2019   OSA on CPAP 05/04/2008   HYPERLIPIDEMIA 03/31/2007   HYPERTENSION 03/31/2007     Past Surgical History:  Procedure Laterality Date   HAND SURGERY     left   HERNIA REPAIR     INGUINAL HERNIA REPAIR     right   NASAL SINUS SURGERY Left 10/16/2020   Procedure: LEFT ENDOSCOPIC ETHMOID AND MAXILLARY SINUS SURGERY;  Surgeon: [name], MD;  Location: [facility] OR;  Service: ENT;  Laterality: Left;   TONSILLECTOMY         Family History  Problem Relation Age of Onset   Heart disease Father    Diabetes Father    Cancer Mother     Social History   Tobacco Use   Smoking status: Never   Smokeless tobacco: Never  Vaping Use   Vaping Use: Never used  Substance Use Topics   Alcohol use: Yes    Comment: occasionally   Drug use: No    Home Medications Prior to Admission medications   Medication Sig Start Date End Date Taking? Authorizing Provider  amLODipine (NORVASC) 5 MG tablet Take 1 tablet (5 mg total) by mouth daily. 09/08/19  Yes Rinaldo Cloud, MD  aspirin 81 MG tablet Take 81 mg by mouth daily.     Yes [provider]  aspirin-acetaminophen-caffeine (EXCEDRIN MIGRAINE) [phone] MG tablet Take 2 tablets by mouth every 6 (six) hours as needed (Sinus).   Yes [provider]  hydrochlorothiazide (HYDRODIURIL) 25 MG tablet Take 25 mg by mouth daily.   Yes  [provider]  HYDROcodone-acetaminophen (NORCO) 7.5-325 MG tablet Take 1 tablet by mouth every 6 (six) hours as needed for moderate pain. 10/16/20  Yes [name], MD  ibuprofen (ADVIL) 200 MG tablet Take 800 mg by mouth every 6 (six) hours as needed for mild pain or moderate pain.   Yes [provider]  isosorbide mononitrate (IMDUR) 30 MG 24 hr tablet Take 1 tablet (30 mg total) by mouth daily. 09/08/19 10/20/20 Yes Rinaldo Cloud, MD  losartan (COZAAR) 100 MG tablet Take 100 mg by mouth daily.   Yes [provider]  nitroGLYCERIN (NITROSTAT) 0.4 MG SL tablet Place 1 tablet (0.4 mg total) under the tongue every 5 (five) minutes x 3 doses as needed for chest  pain. 09/08/19  Yes Rinaldo Cloud, MD  Omega-3 Fatty Acids (FISH OIL) 1200 MG CAPS Take 1 capsule by mouth daily.     Yes [provider]  rosuvastatin (CRESTOR) 10 MG tablet Take 10 mg by mouth daily.     Yes [provider]  promethazine (PHENERGAN) 25 MG suppository Place 1 suppository (25 mg total) rectally every 6 (six) hours as needed for nausea or vomiting. Patient not taking: Reported on 10/20/2020 10/16/20   [name], MD    Allergies    Other and Penicillins  Review of Systems   Review of Systems  Constitutional:  Positive for fatigue. Negative for fever.  Respiratory:  Positive for shortness of breath.   Cardiovascular:  Positive for chest pain.  Gastrointestinal:  Positive for abdominal pain and nausea. Negative for anal bleeding, constipation, diarrhea and vomiting.  Musculoskeletal:  Negative for back pain.  Neurological:  Positive for dizziness and headaches.  Psychiatric/Behavioral:  Positive for confusion (intermittent, not currently confused).    Physical Exam Updated Vital Signs BP (!) 155/89   Pulse 80   Temp 98.4 F (36.9 C) (Oral)   Resp 19   SpO2 96%   Physical Exam Constitutional:      General: He is not in acute distress.    Appearance: He is well-developed. He is obese. He is not ill-appearing.  HENT:     Head: Normocephalic and atraumatic.  Cardiovascular:     Rate and Rhythm: Normal rate and regular rhythm.     Heart sounds: Normal heart sounds.  Pulmonary:     Effort: Pulmonary effort is normal. No tachypnea, accessory muscle usage or respiratory distress.     Breath sounds: Normal breath sounds.  Chest:     Chest wall: Tenderness (in the superior aspect of right chest) present. No mass.  Abdominal:     Palpations: Abdomen is soft. There is no fluid wave or mass.     Tenderness: There is no abdominal tenderness. There is no guarding.  Musculoskeletal:        General: Normal range of motion.     Cervical back: Normal  range of motion and neck supple.  Skin:    General: Skin is warm and dry.     Capillary Refill: Capillary refill takes less than 2 seconds.  Neurological:     General: No focal deficit present.     Mental Status: He is alert.    ED Results / Procedures / Treatments   Labs (all labs ordered are listed, but only abnormal results are displayed) Labs Reviewed  BASIC METABOLIC PANEL - Abnormal; Notable for the following components:      Result Value   Sodium 134 (*)    Glucose, Bld 220 (*)  All other components within normal limits  CBC - Abnormal; Notable for the following components:   WBC 12.4 (*)    All other components within normal limits  TROPONIN I (HIGH SENSITIVITY)  TROPONIN I (HIGH SENSITIVITY)    EKG None NSR with HR of 92, RBB and LAFB unchanged from prior EKGs.  Radiology DG Chest 2 View  Result Date: 10/20/2020 CLINICAL DATA:  Shortness of breath, dizziness, elevated blood pressure, chest pressure EXAM: CHEST - 2 VIEW COMPARISON:  Chest radiograph 09/06/2020 FINDINGS: The cardiomediastinal silhouette is stable. There is no focal consolidation or pulmonary edema. There is no pleural effusion or pneumothorax. There is no acute osseous abnormality. IMPRESSION: No radiographic evidence of acute cardiopulmonary process. Electronically Signed   By: Lesia Hausen M.D.   On: 10/20/2020 08:12   CT Angio Chest PE W and/or Wo Contrast  Result Date: 10/20/2020 CLINICAL DATA:  Shortness of breath.  PE suspected, high prob EXAM: CT ANGIOGRAPHY CHEST WITH CONTRAST TECHNIQUE: Multidetector CT imaging of the chest was performed using the standard protocol during bolus administration of intravenous contrast. Multiplanar CT image reconstructions and MIPs were obtained to evaluate the vascular anatomy. CONTRAST:  OMNIPAQUE IOHEXOL 350 MG/ML SOLN COMPARISON:  Chest x-ray 10/20/2020 FINDINGS: Cardiovascular: Satisfactory opacification of the pulmonary arteries to the segmental level.  No evidence of pulmonary embolism. Thoracic aorta is nonaneurysmal. Scattered atherosclerotic calcifications of the aorta and coronary arteries. Normal heart size. No pericardial effusion. Mediastinum/Nodes: No enlarged mediastinal, hilar, or axillary lymph nodes. Thyroid gland, trachea, and esophagus demonstrate no significant findings. Lungs/Pleura: Punctate calcified granuloma within the left upper lobe. Lung volumes are low. No focal airspace consolidation. No pleural effusion or pneumothorax. Upper Abdomen: Decreased attenuation of the hepatic parenchyma compatible with hepatic steatosis. No acute findings within the visualized upper abdomen. Musculoskeletal: No chest wall abnormality. No acute or significant osseous findings. Review of the MIP images confirms the above findings. IMPRESSION: 1. Negative for pulmonary embolism or other acute intrathoracic process. 2. Hepatic steatosis. 3. Aortic and coronary artery atherosclerosis (ICD10-I70.0). Electronically Signed   By: Duanne Guess D.O.   On: 10/20/2020 12:33    Procedures Procedures   Medications Ordered in ED Medications  iohexol (OMNIPAQUE) 350 MG/ML injection 100 mL (100 mLs Intravenous Contrast Given 10/20/20 1207)    ED Course  I have reviewed the triage vital signs and the nursing notes.  Pertinent labs & imaging results that were available during my care of the patient were reviewed by me and considered in my medical decision making (see chart for details).    MDM Rules/Calculators/A&P                         [name] is a 55 y.o. male presenting with chest pain, shortness of breath, and episodic altered mental status. Had nasal polypectomy on 10/10 without issues. Last night had acute worsening with right chest pain and acute shortness of breath not related to activity. Patient does also have hemoptysis that has worsened  (though recent surgery with blood drainage down throat as well).  Labs significant for WBC slightly  elevated at 12.4. Troponin was reassuring at 9. Glucose was 220. CXR without cardiopulmonary disease. 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   Differential includes MI (unlikely given unchanged EKG and negative Troponin), PE (will obtain CT scan given acute onset of dyspnea and recent surgery), MSK chest wall  pain, pneumothorax or pneumonia (unlikely as CXR was normal)  CT PE scan was negative for PE or acute intrathoracic process.  Recommend follow-up with PCP regarding elevated glucose as it is >200 and could be associate with diabetes onset. Does not seem to be having symptoms at this time.  Discussed all results with patient and his wife, they were understanding that there is no further management from the ER that is necessary at this time; though they should follow-up with cardiology as this can be related to coronary atherosclerosis and patient may benefit from stress test.   Discussed following up with their PCP regarding his blood pressure and glucose. Patient to also follow-up with his surgeon regarding pain management options for his headaches given his recent surgery.  Final Clinical Impression(s) / ED Diagnoses Final diagnoses:  Shortness of breath  Atypical chest pain     Christhoper Busbee, DO 10/20/20 1303    Blane Ohara, MD 10/26/20 1027

## 2022-05-03 IMAGING — CT CT ANGIO CHEST
2 of 7 series · 16 of 46 positions shown · IV contrast (APPLIED)
Comparison: Chest x-ray 10/20/2020

CLINICAL DATA: Shortness of breath.  PE suspected, high prob

EXAM:
CT ANGIOGRAPHY CHEST WITH CONTRAST
TECHNIQUE: Multidetector CT imaging of the chest was performed using the
standard protocol during bolus administration of intravenous
contrast. Multiplanar CT image reconstructions and MIPs were
obtained to evaluate the vascular anatomy.
CONTRAST:  100mL OMNIPAQUE IOHEXOL 350 MG/ML SOLN

[Series 7: thins · axial · 0.80mm/px · z∈[+1124,+1354]mm · 13 of 371 slices shown]
[im 21/371  lung]
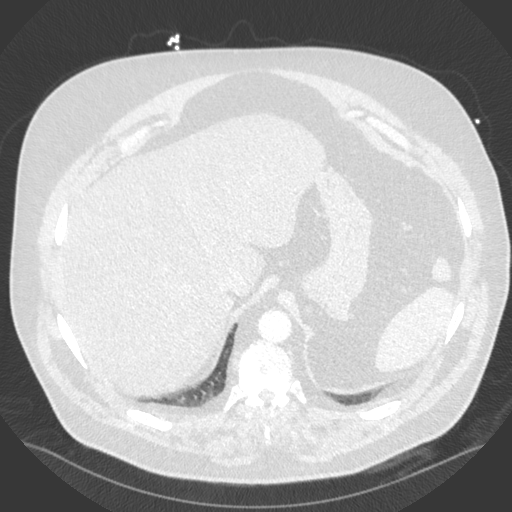
[im 42/371  soft-tissue]
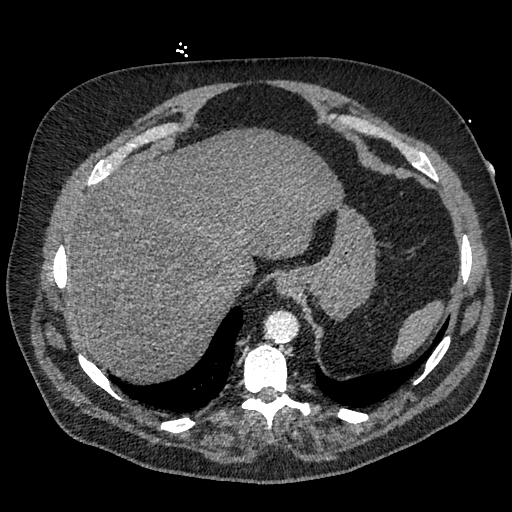
[im 83/371  lung]
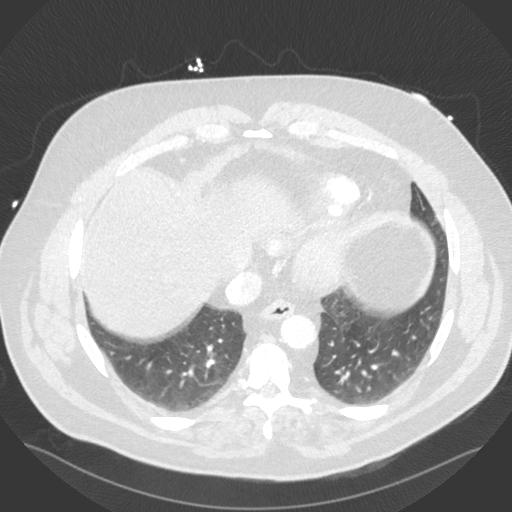
[im 103/371  soft-tissue]
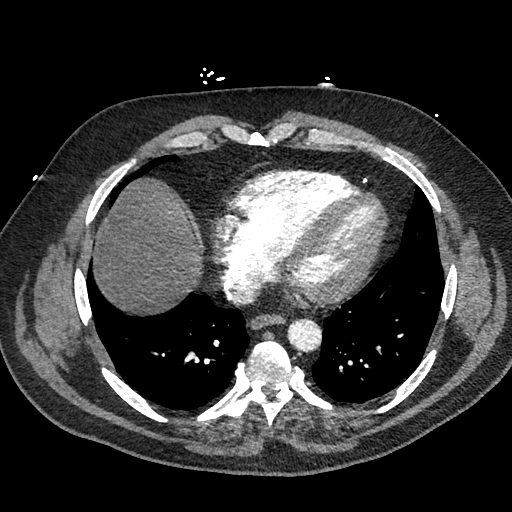
[im 124/371  lung]
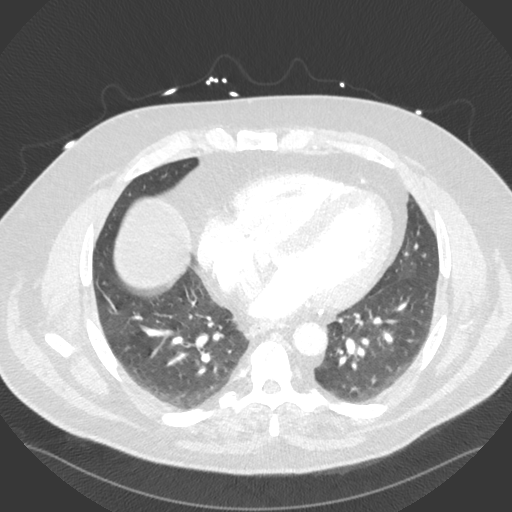
[im 165/371  soft-tissue]
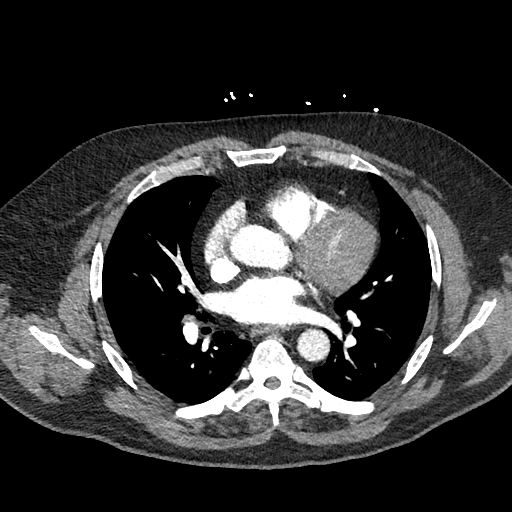
[im 186/371  lung]
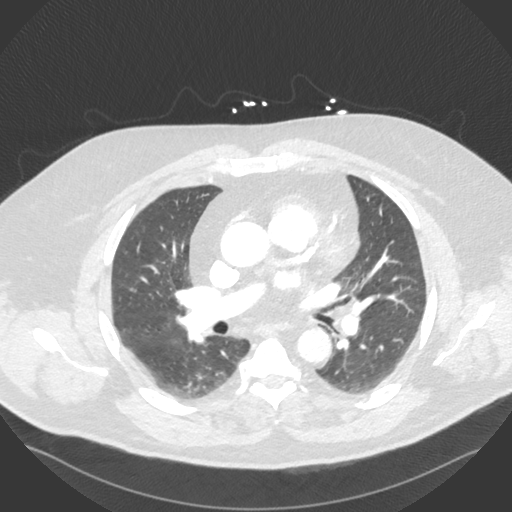
[im 206/371  soft-tissue]
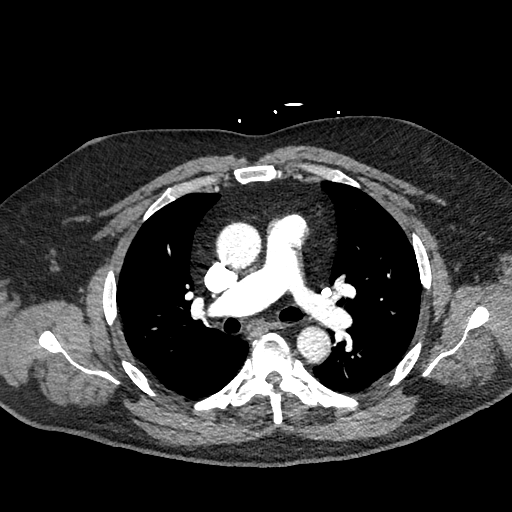
[im 247/371  lung]
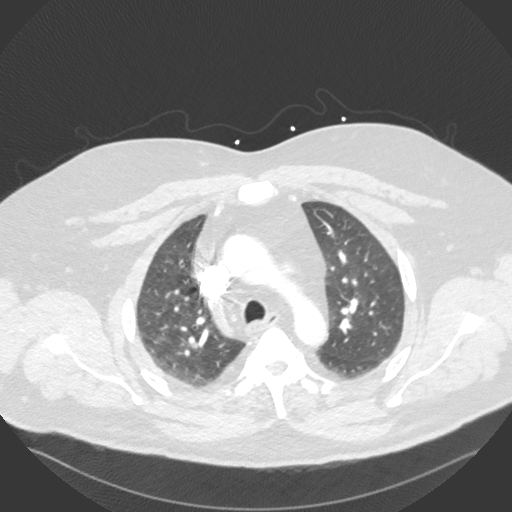
[im 268/371  soft-tissue]
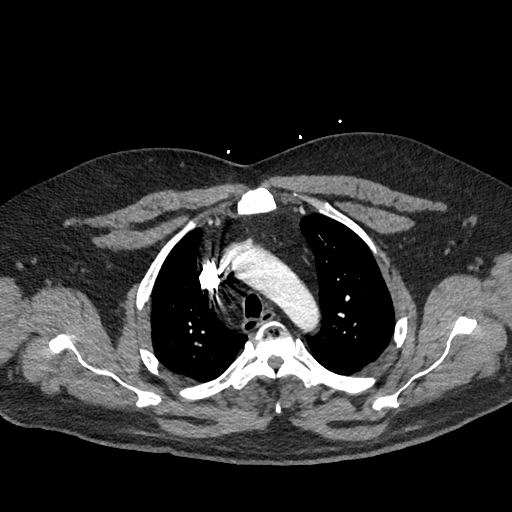
[im 288/371  lung]
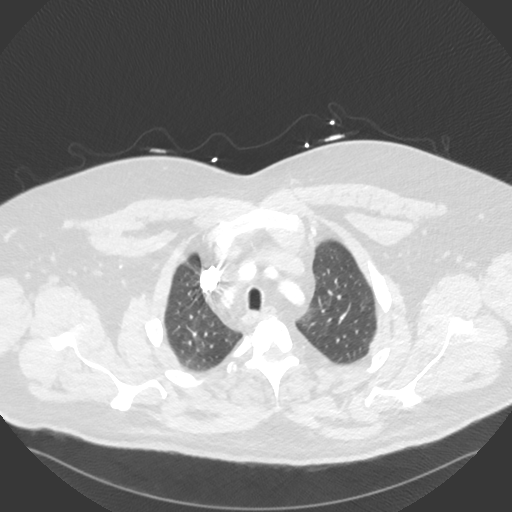
[im 329/371  soft-tissue]
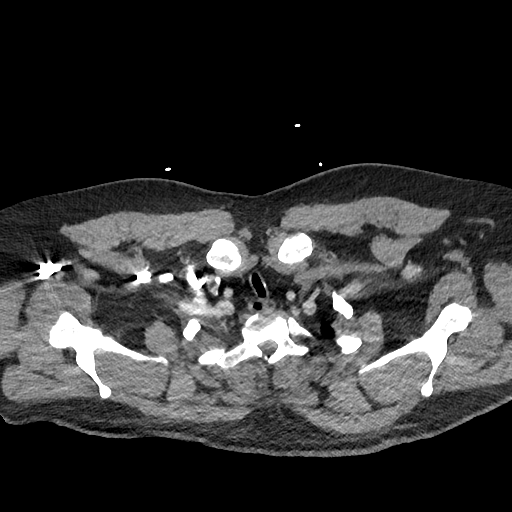
[im 350/371  lung]
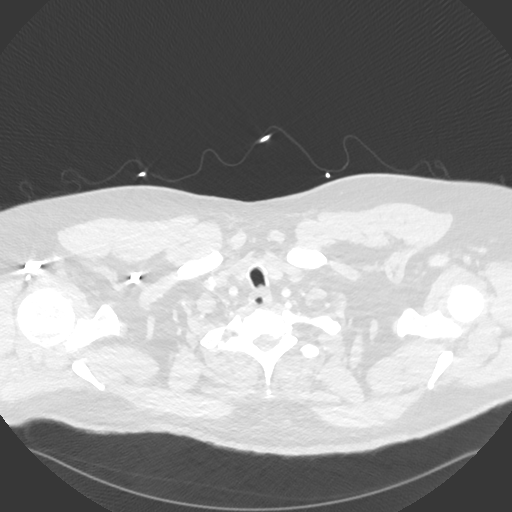

[Series 8: cor · coronal · 0.50mm/px · 3 of 183 slices shown]
[im 46/183  soft-tissue]
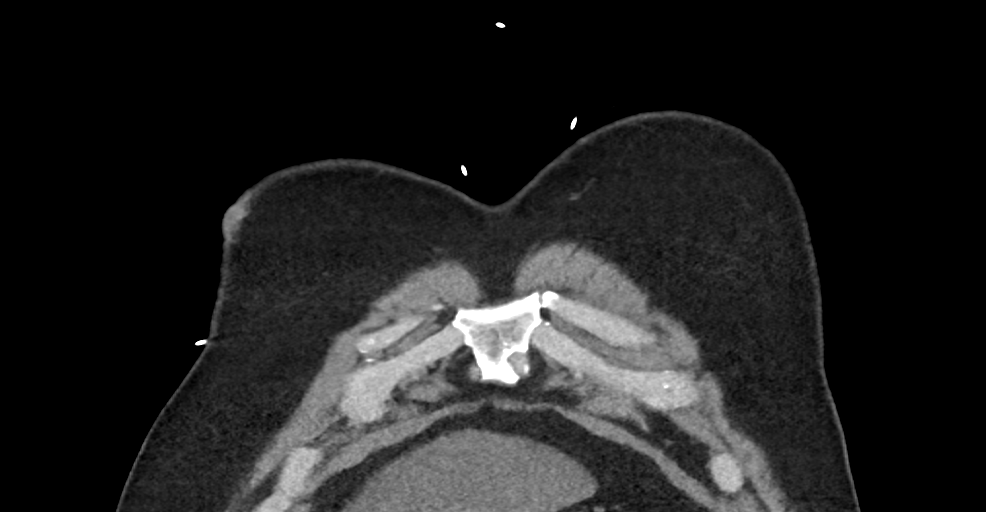
[im 92/183  soft-tissue]
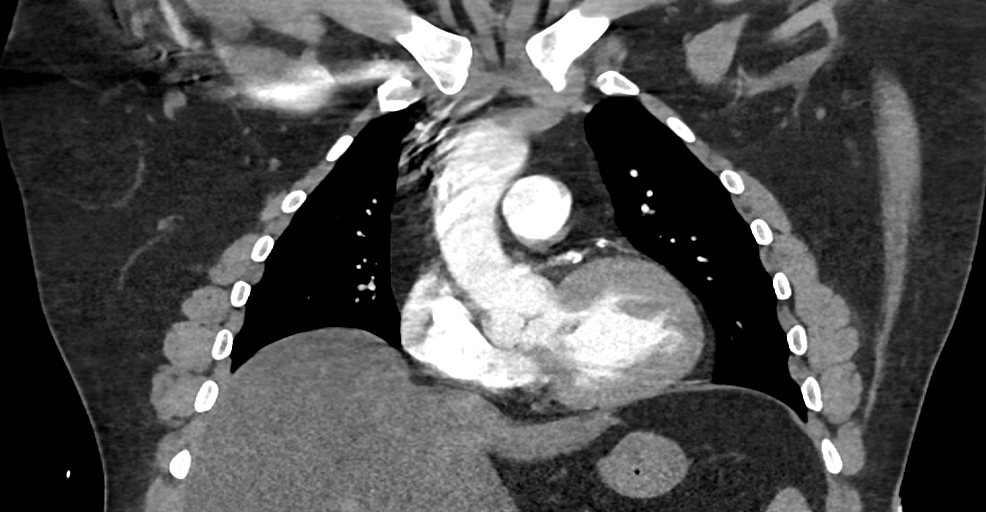
[im 137/183  soft-tissue]
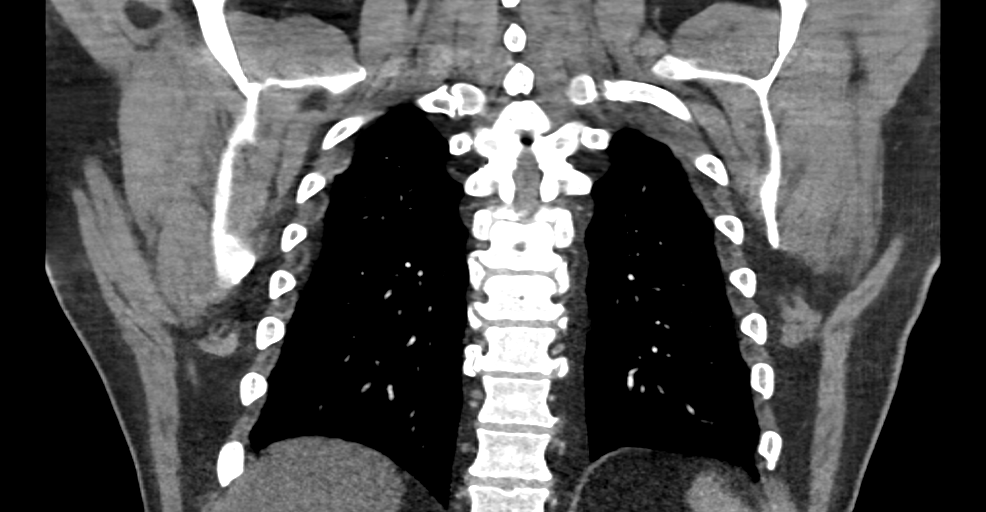

[16 of 46 positions shown; findings below may reference images not displayed]

FINDINGS: Cardiovascular: Satisfactory opacification of the pulmonary arteries
to the segmental level. No evidence of pulmonary embolism. Thoracic
aorta is nonaneurysmal. Scattered atherosclerotic calcifications of
the aorta and coronary arteries. Normal heart size. No pericardial
effusion.

Mediastinum/Nodes: No enlarged mediastinal, hilar, or axillary lymph
nodes. Thyroid gland, trachea, and esophagus demonstrate no
significant findings.

Lungs/Pleura: Punctate calcified granuloma within the left upper
lobe. Lung volumes are low. No focal airspace consolidation. No
pleural effusion or pneumothorax.

Upper Abdomen: Decreased attenuation of the hepatic parenchyma
compatible with hepatic steatosis. No acute findings within the
visualized upper abdomen.

Musculoskeletal: No chest wall abnormality. No acute or significant
osseous findings.

Review of the MIP images confirms the above findings.
IMPRESSION: 1. Negative for pulmonary embolism or other acute intrathoracic
process.
2. Hepatic steatosis.
3. Aortic and coronary artery atherosclerosis ([id number]).
